# Patient Record
Sex: Female | Born: 1960 | Race: White | Hispanic: No | Marital: Married | Smoking: Never smoker
Health system: Southern US, Community
[De-identification: ages and names within clinical notes are randomized; demographics above are authoritative.]

## PROBLEM LIST (undated history)

## (undated) DIAGNOSIS — R32 Unspecified urinary incontinence: Secondary | ICD-10-CM

## (undated) DIAGNOSIS — J42 Unspecified chronic bronchitis: Secondary | ICD-10-CM

## (undated) DIAGNOSIS — D509 Iron deficiency anemia, unspecified: Secondary | ICD-10-CM

## (undated) DIAGNOSIS — K219 Gastro-esophageal reflux disease without esophagitis: Secondary | ICD-10-CM

## (undated) DIAGNOSIS — K2270 Barrett's esophagus without dysplasia: Secondary | ICD-10-CM

## (undated) HISTORY — DX: Gastro-esophageal reflux disease without esophagitis: K21.9

## (undated) HISTORY — DX: Iron deficiency anemia, unspecified: D50.9

## (undated) HISTORY — DX: Unspecified chronic bronchitis: J42

## (undated) HISTORY — PX: TOTAL KNEE ARTHROPLASTY: SHX125

## (undated) HISTORY — DX: Unspecified urinary incontinence: R32

## (undated) HISTORY — DX: Barrett's esophagus without dysplasia: K22.70

## (undated) HISTORY — PX: ENDOMETRIAL ABLATION: SHX621

## (undated) HISTORY — PX: EXTERNAL EAR SURGERY: SHX627

## (undated) HISTORY — PX: JOINT REPLACEMENT: SHX530

---

## 1999-05-22 ENCOUNTER — Emergency Department (HOSPITAL_COMMUNITY): Admission: EM | Admit: 1999-05-22 | Discharge: 1999-05-22 | Payer: Self-pay | Admitting: Emergency Medicine

## 2002-12-14 ENCOUNTER — Emergency Department (HOSPITAL_COMMUNITY): Admission: EM | Admit: 2002-12-14 | Discharge: 2002-12-14 | Payer: Self-pay | Admitting: Emergency Medicine

## 2003-01-10 ENCOUNTER — Emergency Department (HOSPITAL_COMMUNITY): Admission: EM | Admit: 2003-01-10 | Discharge: 2003-01-10 | Payer: Self-pay

## 2003-05-31 ENCOUNTER — Encounter: Payer: Self-pay | Admitting: Emergency Medicine

## 2003-05-31 ENCOUNTER — Emergency Department (HOSPITAL_COMMUNITY): Admission: EM | Admit: 2003-05-31 | Discharge: 2003-05-31 | Payer: Self-pay | Admitting: Emergency Medicine

## 2009-07-10 ENCOUNTER — Encounter: Payer: Self-pay | Admitting: Family Medicine

## 2009-08-28 ENCOUNTER — Emergency Department (HOSPITAL_COMMUNITY): Admission: EM | Admit: 2009-08-28 | Discharge: 2009-08-28 | Payer: Self-pay | Admitting: Emergency Medicine

## 2009-08-28 ENCOUNTER — Encounter: Payer: Self-pay | Admitting: Family Medicine

## 2009-11-02 ENCOUNTER — Ambulatory Visit: Payer: Self-pay | Admitting: Family Medicine

## 2009-11-02 DIAGNOSIS — D649 Anemia, unspecified: Secondary | ICD-10-CM

## 2009-11-02 DIAGNOSIS — R5383 Other fatigue: Secondary | ICD-10-CM

## 2009-11-02 DIAGNOSIS — R5381 Other malaise: Secondary | ICD-10-CM | POA: Insufficient documentation

## 2009-11-06 LAB — CONVERTED CEMR LAB: Vit D, 25-Hydroxy: 18 ng/mL — ABNORMAL LOW (ref 30–89)

## 2009-11-10 ENCOUNTER — Telehealth (INDEPENDENT_AMBULATORY_CARE_PROVIDER_SITE_OTHER): Payer: Self-pay | Admitting: *Deleted

## 2009-11-10 LAB — CONVERTED CEMR LAB
Albumin: 3.9 g/dL (ref 3.5–5.2)
Alkaline Phosphatase: 68 units/L (ref 39–117)
BUN: 12 mg/dL (ref 6–23)
Bilirubin, Direct: 0.1 mg/dL (ref 0.0–0.3)
Creatinine, Ser: 0.8 mg/dL (ref 0.4–1.2)
Eosinophils Relative: 4.2 % (ref 0.0–5.0)
GFR calc non Af Amer: 82.03 mL/min (ref >60)
Iron: 11 ug/dL — ABNORMAL LOW (ref 42–145)
Lymphocytes Relative: 50.4 % — ABNORMAL HIGH (ref 12.0–46.0)
Monocytes Relative: 27.9 % — ABNORMAL HIGH (ref 3.0–12.0)
Neutrophils Relative %: 15.7 % — ABNORMAL LOW (ref 43.0–77.0)
Platelets: 372 10*3/uL (ref 150.0–400.0)
TSH: 0.83 microintl units/mL (ref 0.35–5.50)
Transferrin: 293.7 mg/dL (ref 212.0–360.0)
Vitamin B-12: 419 pg/mL (ref 211–911)
WBC: 7.2 10*3/uL (ref 4.5–10.5)

## 2009-11-22 ENCOUNTER — Encounter: Payer: Self-pay | Admitting: Family Medicine

## 2010-01-06 ENCOUNTER — Ambulatory Visit: Payer: Self-pay | Admitting: Family Medicine

## 2010-01-06 DIAGNOSIS — H6690 Otitis media, unspecified, unspecified ear: Secondary | ICD-10-CM | POA: Insufficient documentation

## 2010-01-06 LAB — CONVERTED CEMR LAB: Inflenza A Ag: NEGATIVE

## 2010-01-14 ENCOUNTER — Telehealth: Payer: Self-pay | Admitting: Family Medicine

## 2010-01-20 ENCOUNTER — Ambulatory Visit: Payer: Self-pay | Admitting: Family Medicine

## 2010-01-25 LAB — CONVERTED CEMR LAB
Basophils Absolute: 0.1 10*3/uL (ref 0.0–0.1)
Eosinophils Absolute: 0.2 10*3/uL (ref 0.0–0.7)
Hemoglobin: 9.7 g/dL — ABNORMAL LOW (ref 12.0–15.0)
Iron: 15 ug/dL — ABNORMAL LOW (ref 42–145)
Lymphocytes Relative: 38.7 % (ref 12.0–46.0)
Lymphs Abs: 3 10*3/uL (ref 0.7–4.0)
MCHC: 31.1 g/dL (ref 30.0–36.0)
Monocytes Absolute: 2 10*3/uL — ABNORMAL HIGH (ref 0.1–1.0)
Neutro Abs: 2.5 10*3/uL (ref 1.4–7.7)
RDW: 18.4 % — ABNORMAL HIGH (ref 11.5–14.6)
Transferrin: 306.7 mg/dL (ref 212.0–360.0)
Vitamin B-12: 505 pg/mL (ref 211–911)

## 2010-01-28 ENCOUNTER — Telehealth (INDEPENDENT_AMBULATORY_CARE_PROVIDER_SITE_OTHER): Payer: Self-pay | Admitting: *Deleted

## 2010-01-31 ENCOUNTER — Ambulatory Visit: Payer: Self-pay | Admitting: Hematology & Oncology

## 2010-02-10 ENCOUNTER — Encounter: Payer: Self-pay | Admitting: Family Medicine

## 2010-02-10 LAB — CBC WITH DIFFERENTIAL (CANCER CENTER ONLY)
EOS%: 3.2 % (ref 0.0–7.0)
Eosinophils Absolute: 0.3 10*3/uL (ref 0.0–0.5)
LYMPH%: 36.5 % (ref 14.0–48.0)
MCH: 21.4 pg — ABNORMAL LOW (ref 26.0–34.0)
MCHC: 30.7 g/dL — ABNORMAL LOW (ref 32.0–36.0)
MCV: 70 fL — ABNORMAL LOW (ref 81–101)
MONO%: 7.6 % (ref 0.0–13.0)
Platelets: 376 10*3/uL (ref 145–400)
RBC: 4.87 10*6/uL (ref 3.70–5.32)

## 2010-02-10 LAB — CHCC SATELLITE - SMEAR

## 2010-02-11 LAB — TRANSFERRIN RECEPTOR, SOLUABLE: Transferrin Receptor, Soluble: 32.1 nmol/L

## 2010-02-12 LAB — COMPREHENSIVE METABOLIC PANEL
Alkaline Phosphatase: 84 U/L (ref 39–117)
CO2: 17 mEq/L — ABNORMAL LOW (ref 19–32)
Creatinine, Ser: 0.81 mg/dL (ref 0.40–1.20)
Glucose, Bld: 92 mg/dL (ref 70–99)
Total Bilirubin: 0.2 mg/dL — ABNORMAL LOW (ref 0.3–1.2)

## 2010-02-18 LAB — FECAL OCCULT BLOOD, GUIAC - CHCC SATELLITE: Occult Blood: NEGATIVE

## 2010-03-29 ENCOUNTER — Ambulatory Visit: Payer: Self-pay | Admitting: Hematology & Oncology

## 2010-04-01 ENCOUNTER — Encounter: Payer: Self-pay | Admitting: Family Medicine

## 2010-04-01 LAB — CBC WITH DIFFERENTIAL (CANCER CENTER ONLY)
BASO%: 0.9 % (ref 0.0–2.0)
Eosinophils Absolute: 0.3 10*3/uL (ref 0.0–0.5)
LYMPH#: 2.5 10*3/uL (ref 0.9–3.3)
LYMPH%: 34.6 % (ref 14.0–48.0)
MONO#: 0.5 10*3/uL (ref 0.1–0.9)
NEUT#: 3.8 10*3/uL (ref 1.5–6.5)
Platelets: 328 10*3/uL (ref 145–400)
RBC: 4.71 10*6/uL (ref 3.70–5.32)
RDW: 21.1 % — ABNORMAL HIGH (ref 10.5–14.6)
WBC: 7.1 10*3/uL (ref 3.9–10.0)

## 2010-04-01 LAB — CHCC SATELLITE - SMEAR

## 2010-04-03 LAB — RETICULOCYTES (CHCC)
ABS Retic: 28.1 10*3/uL (ref 19.0–186.0)
RBC.: 4.68 MIL/uL (ref 3.87–5.11)
Retic Ct Pct: 0.6 % (ref 0.4–3.1)

## 2010-04-03 LAB — FERRITIN: Ferritin: 72 ng/mL (ref 10–291)

## 2010-04-20 ENCOUNTER — Emergency Department (HOSPITAL_BASED_OUTPATIENT_CLINIC_OR_DEPARTMENT_OTHER): Admission: EM | Admit: 2010-04-20 | Discharge: 2010-04-20 | Payer: Self-pay | Admitting: Emergency Medicine

## 2010-04-20 ENCOUNTER — Ambulatory Visit: Payer: Self-pay | Admitting: Family

## 2010-04-20 ENCOUNTER — Telehealth: Payer: Self-pay | Admitting: Family Medicine

## 2010-04-20 ENCOUNTER — Telehealth (INDEPENDENT_AMBULATORY_CARE_PROVIDER_SITE_OTHER): Payer: Self-pay | Admitting: *Deleted

## 2010-04-20 ENCOUNTER — Ambulatory Visit: Payer: Self-pay | Admitting: Diagnostic Radiology

## 2010-04-20 DIAGNOSIS — R4182 Altered mental status, unspecified: Secondary | ICD-10-CM | POA: Insufficient documentation

## 2010-04-20 DIAGNOSIS — R3129 Other microscopic hematuria: Secondary | ICD-10-CM

## 2010-04-20 LAB — CONVERTED CEMR LAB
Bilirubin Urine: NEGATIVE
Nitrite: NEGATIVE
Urobilinogen, UA: 0.2
WBC Urine, dipstick: NEGATIVE

## 2010-04-21 ENCOUNTER — Encounter: Payer: Self-pay | Admitting: Family

## 2010-04-22 ENCOUNTER — Telehealth (INDEPENDENT_AMBULATORY_CARE_PROVIDER_SITE_OTHER): Payer: Self-pay | Admitting: *Deleted

## 2010-04-26 ENCOUNTER — Ambulatory Visit: Payer: Self-pay | Admitting: Family Medicine

## 2010-04-27 ENCOUNTER — Encounter (INDEPENDENT_AMBULATORY_CARE_PROVIDER_SITE_OTHER): Payer: Self-pay | Admitting: *Deleted

## 2010-04-27 ENCOUNTER — Telehealth: Payer: Self-pay | Admitting: Family Medicine

## 2010-05-13 ENCOUNTER — Ambulatory Visit (HOSPITAL_COMMUNITY): Admission: RE | Admit: 2010-05-13 | Discharge: 2010-05-13 | Payer: Self-pay | Admitting: Family Medicine

## 2010-06-21 ENCOUNTER — Ambulatory Visit: Payer: Self-pay | Admitting: Hematology & Oncology

## 2010-07-13 ENCOUNTER — Encounter: Payer: Self-pay | Admitting: Family Medicine

## 2010-07-13 LAB — CBC WITH DIFFERENTIAL (CANCER CENTER ONLY)
BASO#: 0.1 10*3/uL (ref 0.0–0.2)
EOS%: 3.7 % (ref 0.0–7.0)
Eosinophils Absolute: 0.3 10*3/uL (ref 0.0–0.5)
HGB: 13.5 g/dL (ref 11.6–15.9)
LYMPH#: 2.2 10*3/uL (ref 0.9–3.3)
MCHC: 33.7 g/dL (ref 32.0–36.0)
MONO%: 6.1 % (ref 0.0–13.0)
NEUT#: 5.1 10*3/uL (ref 1.5–6.5)
Platelets: 334 10*3/uL (ref 145–400)
RBC: 4.38 10*6/uL (ref 3.70–5.32)

## 2010-07-13 LAB — RETICULOCYTES (CHCC)
ABS Retic: 79.6 10*3/uL (ref 19.0–186.0)
RBC.: 4.42 MIL/uL (ref 3.87–5.11)
Retic Ct Pct: 1.8 % (ref 0.4–3.1)

## 2010-07-13 LAB — FERRITIN: Ferritin: 46 ng/mL (ref 10–291)

## 2010-08-23 ENCOUNTER — Ambulatory Visit: Payer: Self-pay | Admitting: Family Medicine

## 2010-08-23 DIAGNOSIS — M25569 Pain in unspecified knee: Secondary | ICD-10-CM

## 2010-08-24 ENCOUNTER — Encounter: Payer: Self-pay | Admitting: Family Medicine

## 2010-10-05 ENCOUNTER — Ambulatory Visit: Payer: Self-pay | Admitting: Internal Medicine

## 2010-10-05 DIAGNOSIS — E559 Vitamin D deficiency, unspecified: Secondary | ICD-10-CM | POA: Insufficient documentation

## 2010-10-05 DIAGNOSIS — R51 Headache: Secondary | ICD-10-CM

## 2010-10-05 DIAGNOSIS — IMO0001 Reserved for inherently not codable concepts without codable children: Secondary | ICD-10-CM | POA: Insufficient documentation

## 2010-10-05 DIAGNOSIS — D509 Iron deficiency anemia, unspecified: Secondary | ICD-10-CM

## 2010-10-05 DIAGNOSIS — R519 Headache, unspecified: Secondary | ICD-10-CM | POA: Insufficient documentation

## 2010-10-07 LAB — CONVERTED CEMR LAB
Basophils Absolute: 0 10*3/uL (ref 0.0–0.1)
Basophils Relative: 0.3 % (ref 0.0–3.0)
Eosinophils Absolute: 0.1 10*3/uL (ref 0.0–0.7)
HCT: 41.1 % (ref 36.0–46.0)
Hemoglobin: 14.1 g/dL (ref 12.0–15.0)
Lymphs Abs: 2.5 10*3/uL (ref 0.7–4.0)
MCHC: 34.4 g/dL (ref 30.0–36.0)
MCV: 93 fL (ref 78.0–100.0)
Magnesium: 2.1 mg/dL (ref 1.5–2.5)
Neutro Abs: 3.7 10*3/uL (ref 1.4–7.7)
Potassium: 4.2 meq/L (ref 3.5–5.1)
RDW: 13.5 % (ref 11.5–14.6)
Total CK: 60 units/L (ref 7–177)

## 2010-11-14 ENCOUNTER — Ambulatory Visit: Payer: Self-pay | Admitting: Hematology & Oncology

## 2010-11-16 ENCOUNTER — Encounter: Payer: Self-pay | Admitting: Family Medicine

## 2010-11-16 LAB — CBC WITH DIFFERENTIAL (CANCER CENTER ONLY)
BASO#: 0.1 10*3/uL (ref 0.0–0.2)
BASO%: 0.6 % (ref 0.0–2.0)
Eosinophils Absolute: 0.2 10*3/uL (ref 0.0–0.5)
HCT: 39.8 % (ref 34.8–46.6)
HGB: 13.6 g/dL (ref 11.6–15.9)
LYMPH#: 2.4 10*3/uL (ref 0.9–3.3)
MCV: 93 fL (ref 81–101)
MONO#: 0.6 10*3/uL (ref 0.1–0.9)
NEUT%: 61.6 % (ref 39.6–80.0)
RBC: 4.3 10*6/uL (ref 3.70–5.32)
WBC: 8.5 10*3/uL (ref 3.9–10.0)

## 2010-11-16 LAB — RETICULOCYTES (CHCC)
ABS Retic: 64.2 10*3/uL (ref 19.0–186.0)
RBC.: 4.28 MIL/uL (ref 3.87–5.11)

## 2010-11-16 LAB — FERRITIN: Ferritin: 37 ng/mL (ref 10–291)

## 2010-12-11 HISTORY — PX: COLONOSCOPY: SHX174

## 2010-12-15 IMAGING — CT CT HEAD W/O CM
1 series · 16 of 30 positions shown, 20 images · non-contrast
Comparison: None.

CLINICAL DATA: Headache and confusion.  Dizziness.

CT HEAD WITHOUT CONTRAST
TECHNIQUE: Contiguous axial images were obtained from the base of
the skull through the vertex without contrast.

[Series 2: head 4.8 h37s · axial · 0.43mm/px · z∈[+1164,+1301]mm · 16 of 32 slices shown, 20 images]
[im 2/32  brain]
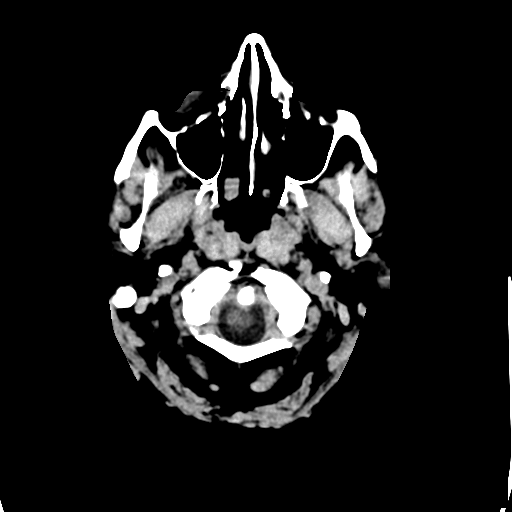
[im 2/32  bone]
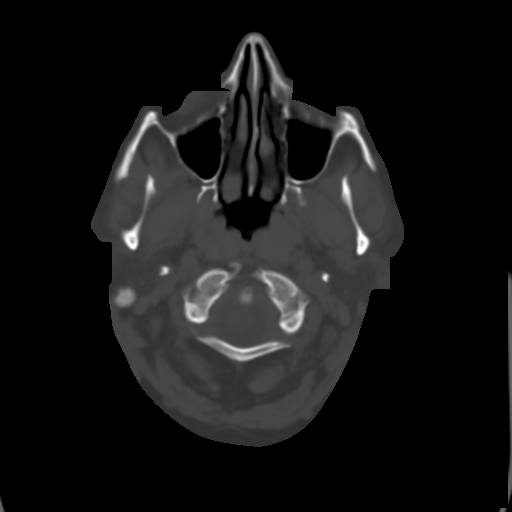
[im 4/32  brain]
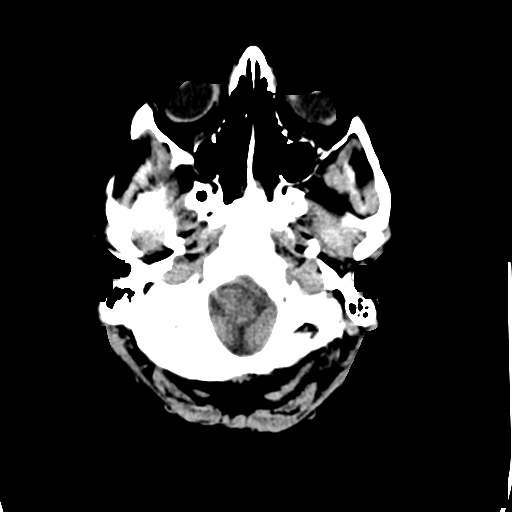
[im 6/32  brain]
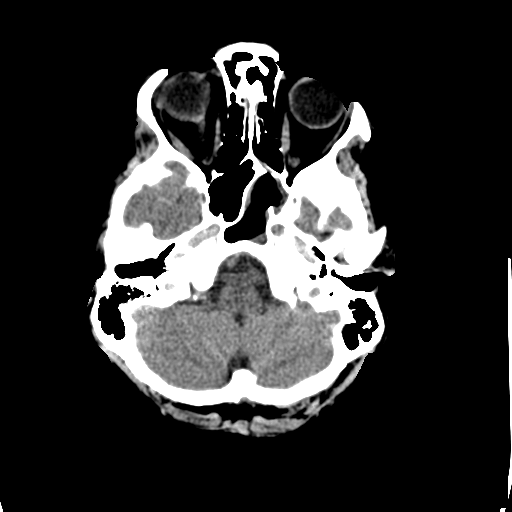
[im 8/32  brain]
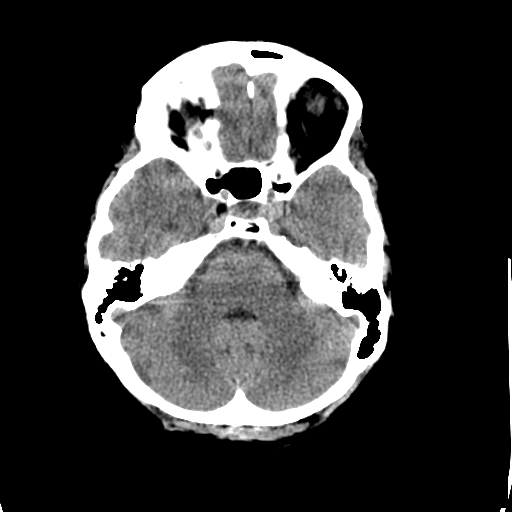
[im 9/32  brain]
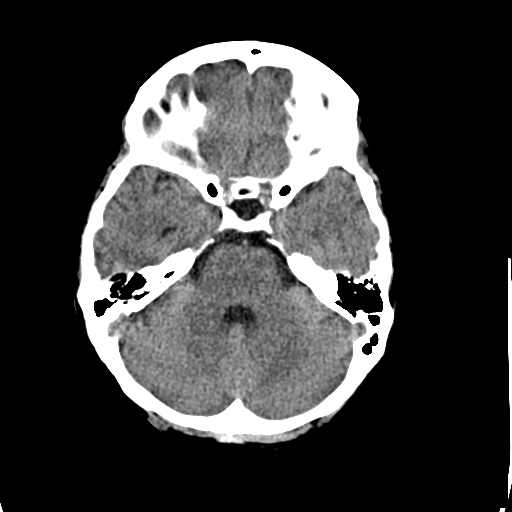
[im 9/32  bone]
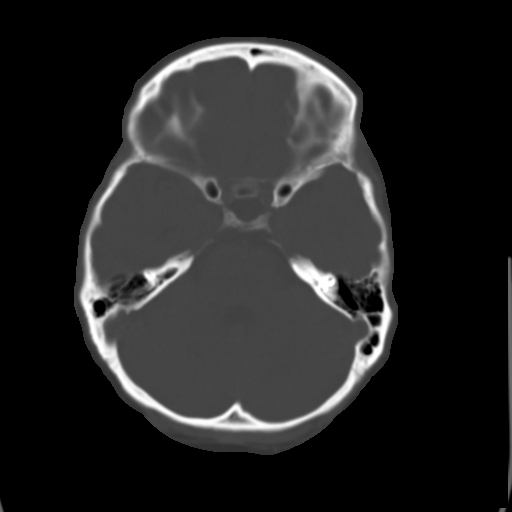
[im 11/32  brain]
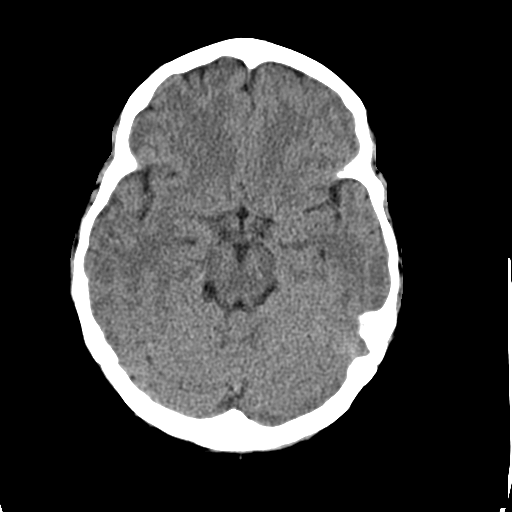
[im 13/32  brain]
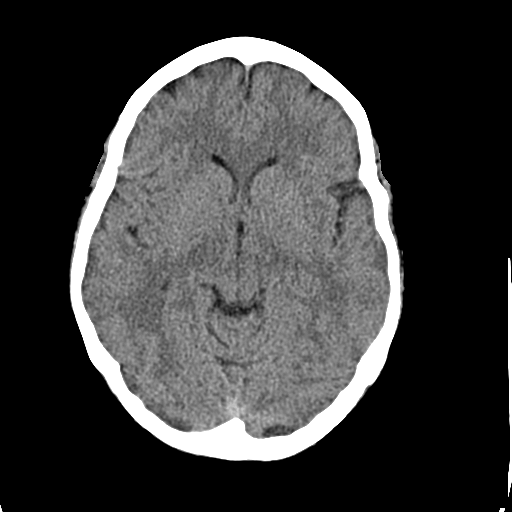
[im 15/32  brain]
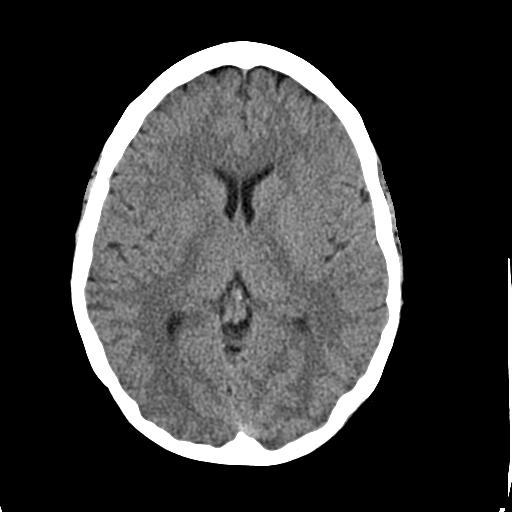
[im 17/32  brain]
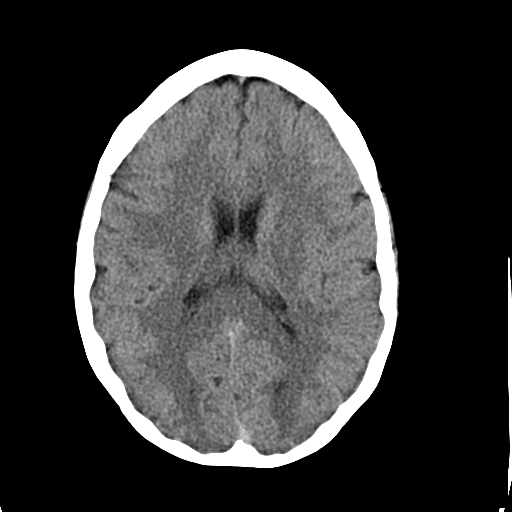
[im 17/32  bone]
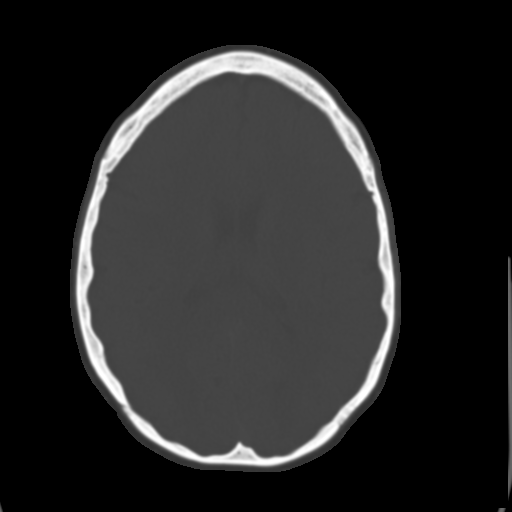
[im 19/32  brain]
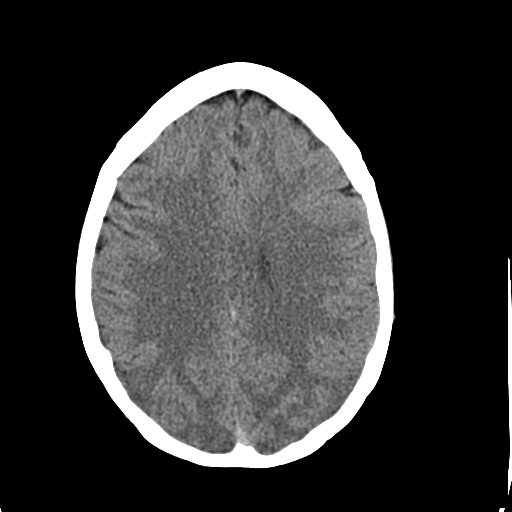
[im 21/32  brain]
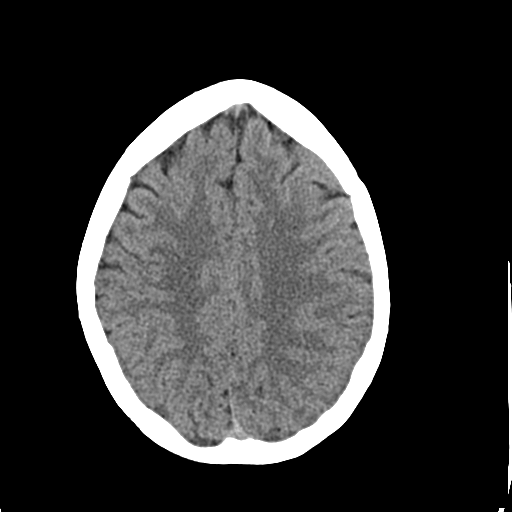
[im 23/32  brain]
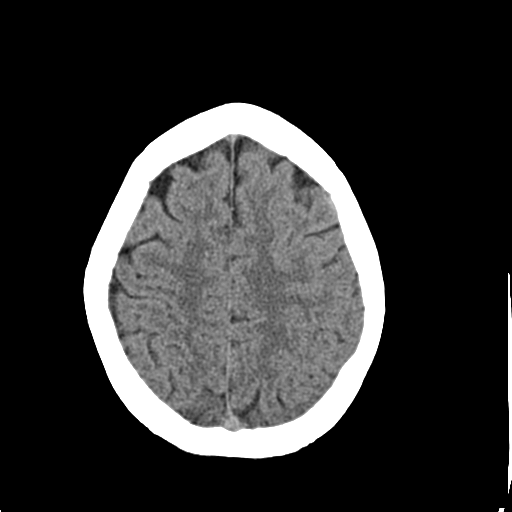
[im 24/32  brain]
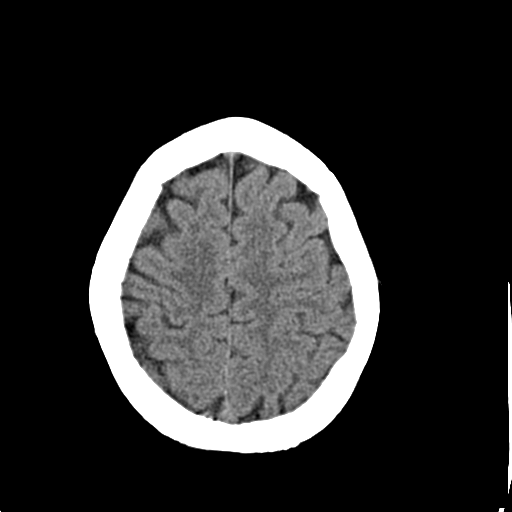
[im 24/32  bone]
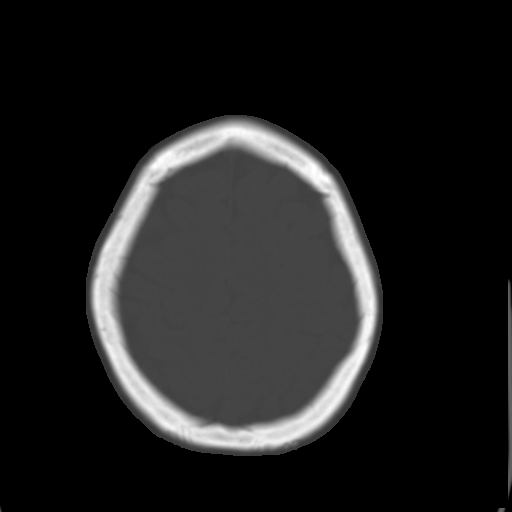
[im 26/32  brain]
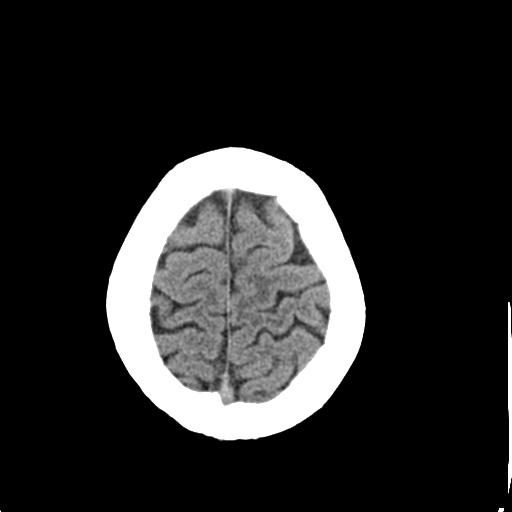
[im 28/32  brain]
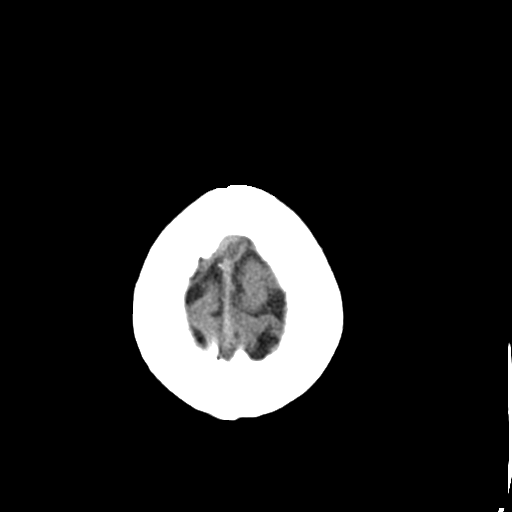
[im 30/32  brain]
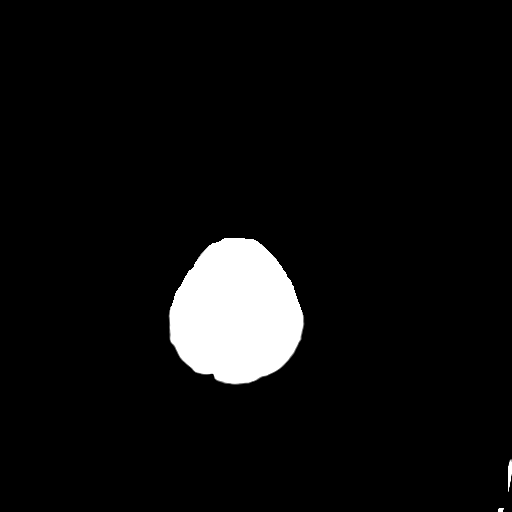

[16 of 30 positions shown; findings below may reference images not displayed]

FINDINGS: There is no acute intracranial hemorrhage, acute
infarction, or intracranial mass lesion.  There is a prominent
sulcus in the right occipital lobe on image number 19 and 20 but
this is not felt to represent an infarct.  No bony abnormalities of
significance.  There is mild mucosal thickening in the left side of
the sphenoid sinus.
IMPRESSION: No acute intracranial abnormalities.

## 2010-12-28 ENCOUNTER — Ambulatory Visit
Admission: RE | Admit: 2010-12-28 | Discharge: 2010-12-28 | Payer: Self-pay | Source: Home / Self Care | Attending: Internal Medicine | Admitting: Internal Medicine

## 2010-12-28 DIAGNOSIS — J069 Acute upper respiratory infection, unspecified: Secondary | ICD-10-CM | POA: Insufficient documentation

## 2011-01-07 IMAGING — CR DG CHEST 2V
2 series · 2 of 2 positions shown · non-contrast
Comparison: 04/20/2010

CLINICAL DATA: Acute bronchitis.  Cough.

CHEST - 2 VIEW

[view not recorded (1 of 2)]
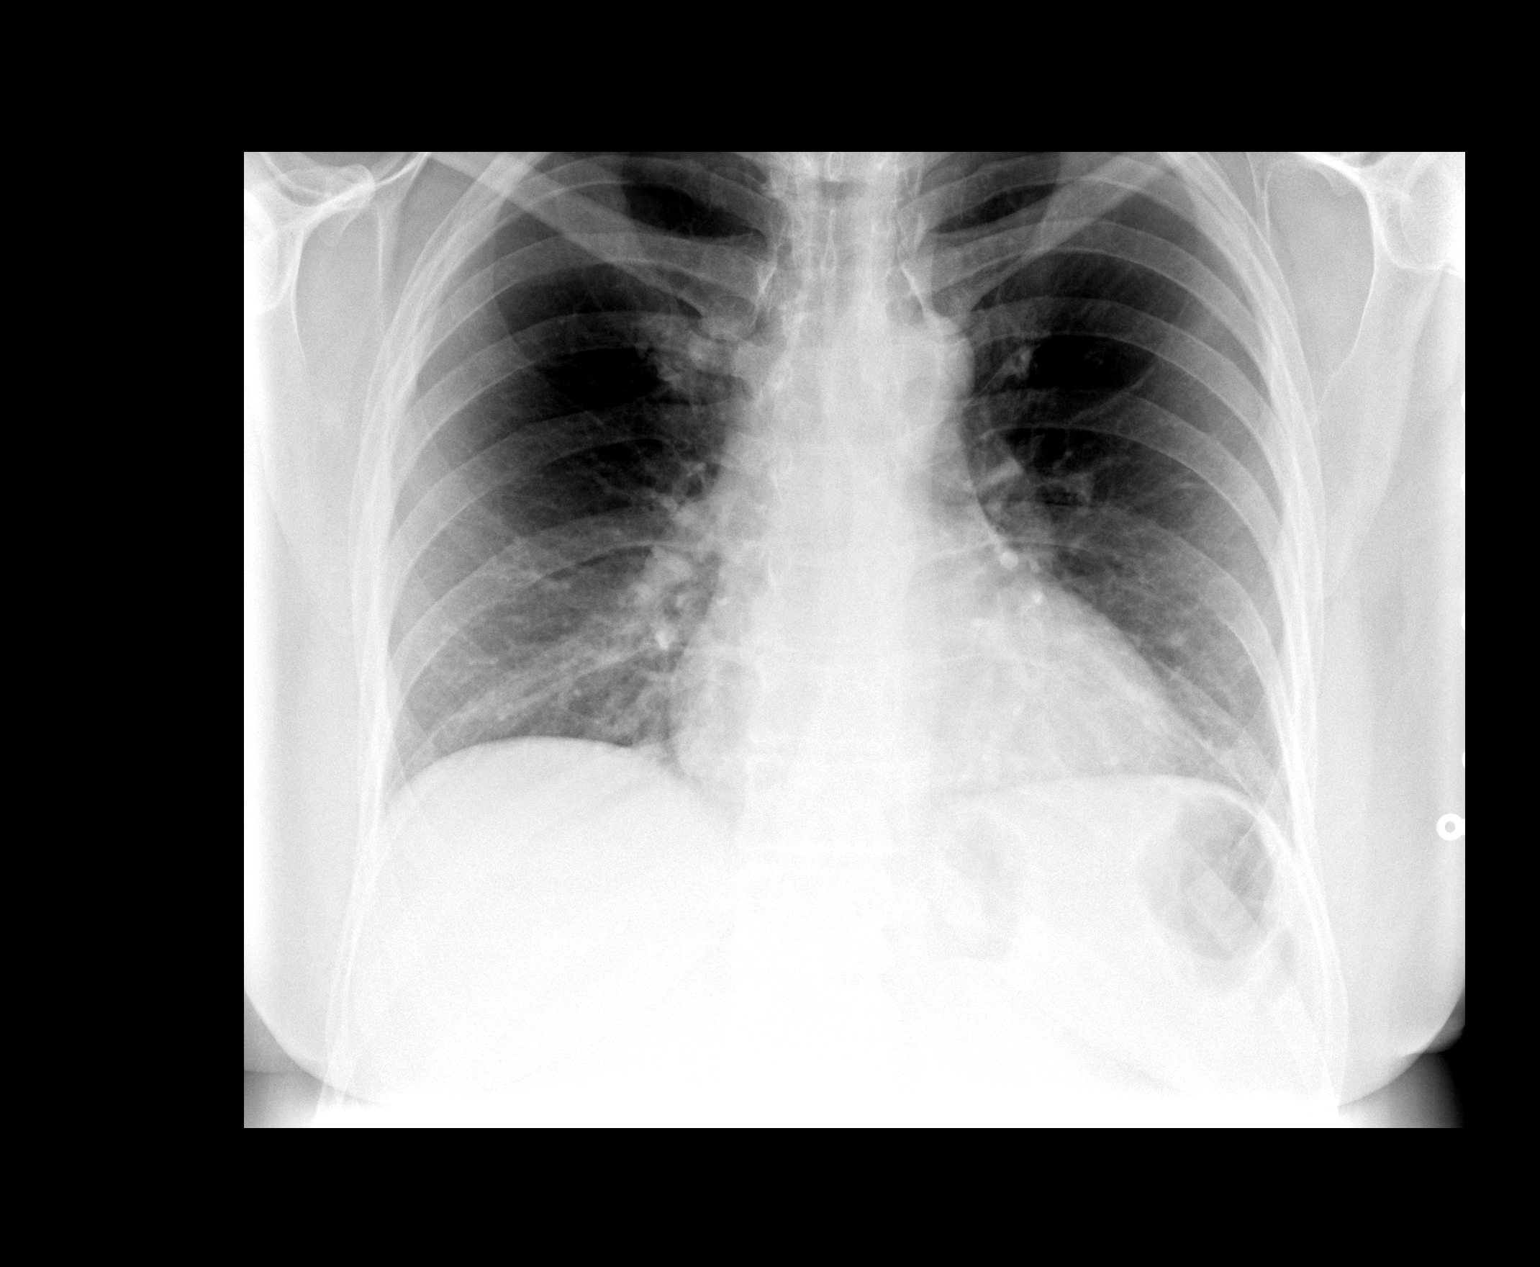

[view not recorded (2 of 2)]
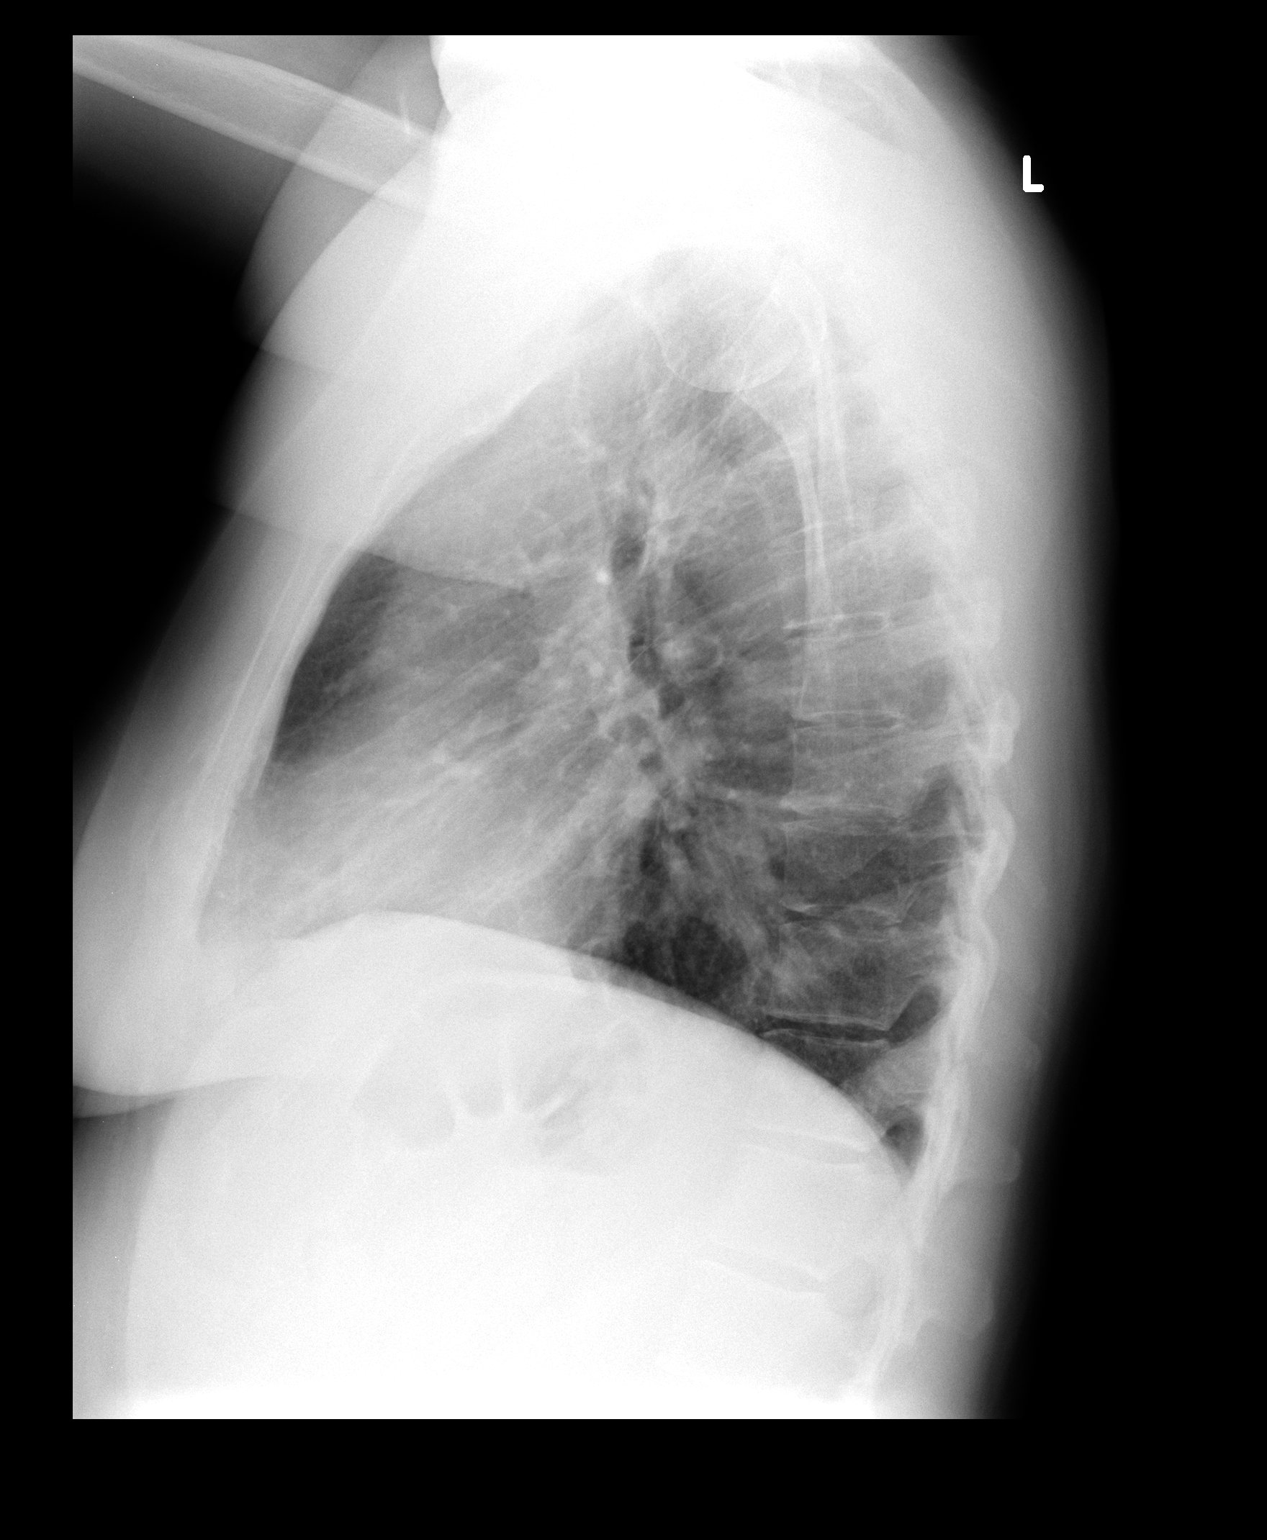

[2 of 2 positions shown; findings below may reference images not displayed]

FINDINGS: The cardiac silhouette, mediastinal and hilar contours
are within normal limits and stable.  Low lung volumes with
vascular crowding and bibasilar atelectasis.  No infiltrates, edema
or effusions.  The bony thorax is intact
IMPRESSION: Low lung volumes with vascular crowding and bibasilar atelectasis.
No infiltrates, edema or effusions.

## 2011-01-10 NOTE — Progress Notes (Signed)
Summary: still no better  Phone Note Call from Patient Call back at Home Phone [phone]   Caller: Patient Summary of Call: PT left VM that she was seen on 01-06-10 for sinus infection and was rx CHERATUSSIN and Biaxin. pt still c/o sore throat, ear pain and head congestion. pt would like to know if you can rx another med. pls advise...............Marland KitchenFelecia Deloach CMA  January 14, 2010 2:05 PM   Follow-up for Phone Call        avelox 400 1 by mouth once daily #10  ----  ov if no better  Follow-up by: Loreen Freud DO,  January 14, 2010 2:59 PM  Additional Follow-up for Phone Call Additional follow up Details #1::        pt aware rx sent to pharmacy....................Marland KitchenFelecia Deloach CMA  January 14, 2010 3:57 PM     New/Updated Medications: AVELOX 400 MG TABS (MOXIFLOXACIN HCL) 1 by mouth once daily Prescriptions: AVELOX 400 MG TABS (MOXIFLOXACIN HCL) 1 by mouth once daily  #10 x 0   Entered by:   Jeremy Johann CMA   Authorized by:   Loreen Freud DO   Signed by:   Jeremy Johann CMA on 01/14/2010   Method used:   Faxed to ...       [facility]       [address]       [place], [place]  [address]       Ph: [phone]       Fax: [phone]   RxID:   [id number]

## 2011-01-10 NOTE — Progress Notes (Signed)
Summary: ed f/u scheduled [phone]  Phone Note Outgoing Call   Summary of Call: Please contact patient and arrange a follow up visit with Dr. Laury Axon early next week.  Thanks Initial call taken by: Lemont Fillers FNP,  Apr 22, 2010 9:12 AM  Follow-up for Phone Call        patient has appt scheduled [id number]  Follow-up by: Okey Regal Spring,  Apr 22, 2010 1:07 PM

## 2011-01-10 NOTE — Assessment & Plan Note (Signed)
Summary: Still feels bad& feels like its moved to her chest- jr   Vital Signs:  Patient profile:   50 year old female Weight:      231 pounds Temp:     98.2 degrees F oral Pulse rate:   86 / minute Pulse rhythm:   regular BP sitting:   124 / 82  (left arm) Cuff size:   regular  Vitals Entered By: Army Fossa CMA (January 20, 2010 3:22 PM) CC: Pt states she no longer has the HA, she has a lot of chest congestion.   History of Present Illness: Pt states the symptoms are no longer in her head.  She feels like they have moved into her chest.  See last ov.  Current Medications (verified): 1)  Nexium 20 Mg Cpdr (Esomeprazole Magnesium) .Marland Kitchen.. 1 By Mouth Daily. 2)  Iron 25 Mg Tabs (Iron) .Marland Kitchen.. 1 By Mouth Three Times A Day 3)  Avelox 400 Mg Tabs (Moxifloxacin Hcl) .Marland Kitchen.. 1 By Mouth Once Daily 4)  Cheratussin Ac 100-10 Mg/1ml Syrp (Guaifenesin-Codeine) .Marland Kitchen.. 1-2 Tsp By Mouth At Bedtime 5)  Nasonex 50 Mcg/act Susp (Mometasone Furoate) 6)  Prednisone 10 Mg Tabs (Prednisone) .... 3 By Mouth Once Daily For 3 Days Then2 By Mouth Once Daily For 3 Days Then 1 By Mouth Once Daily For 3 Days 7)  Symbicort 160-4.5 Mcg/act Aero (Budesonide-Formoterol Fumarate) .... 2 Puffs Two Times A Day 8)  Proair Hfa 108 (90 Base) Mcg/act Aers (Albuterol Sulfate) .... 2 Puffs Qid As Needed  Allergies: 1)  ! Penicillin 2)  ! * Maxiquin 3)  ! * Tamiflu 4)  ! Sulfa 5)  ! Fluzone (Influenza Virus Vaccine Split)  Past History:  Past medical, surgical, family and social histories (including risk factors) reviewed for relevance to current acute and chronic problems.  Past Medical History: Reviewed history from 11/02/2009 and no changes required. Anemia GERD Chronic bronchitis Urine incontinence UTI   Past Surgical History: Reviewed history from 11/02/2009 and no changes required. Andoscopy- 2005 Ear bone replacement- 2004  Family History: Reviewed history from 11/02/2009 and no changes required. Family  History of Alcoholism/Addiction Family History Breast cancer 1st degree relative <50 Family History of Stroke F 1st degree relative <60 Family History of Sudden Death Heart disease   Social History: Reviewed history from 11/02/2009 and no changes required. Married Never Smoked Alcohol use-yes Drug use-no Regular exercise-no  Review of Systems      See HPI  Physical Exam  General:  Well-developed,well-nourished,in no acute distress; alert,appropriate and cooperative throughout examination Ears:  External ear exam shows no significant lesions or deformities.  Otoscopic examination reveals clear canals, tympanic membranes are intact bilaterally without bulging, retraction, inflammation or discharge. Hearing is grossly normal bilaterally. Nose:  External nasal examination shows no deformity or inflammation. Nasal mucosa are pink and moist without lesions or exudates. Mouth:  Oral mucosa and oropharynx without lesions or exudates.  Teeth in good repair. Neck:  No deformities, masses, or tenderness noted. Lungs:  R decreased breath sounds and L decreased breath sounds.   Heart:  normal rate and no murmur.   Extremities:  No clubbing, cyanosis, edema, or deformity noted with normal full range of motion of all joints.   Neurologic:  No cranial nerve deficits noted. Station and gait are normal. Plantar reflexes are down-going bilaterally. DTRs are symmetrical throughout. Sensory, motor and coordinative functions appear intact. Skin:  Intact without suspicious lesions or rashes Cervical Nodes:  No lymphadenopathy noted Psych:  Oriented  X3 and normally interactive.     Impression & Recommendations:  Problem # 1:  BRONCHITIS- ACUTE (ICD-466.0)  Her updated medication list for this problem includes:    Avelox 400 Mg Tabs (Moxifloxacin hcl) .Marland Kitchen... 1 by mouth once daily    Cheratussin Ac 100-10 Mg/67ml Syrp (Guaifenesin-codeine) .Marland Kitchen... 1-2 tsp by mouth at bedtime    Symbicort 160-4.5 Mcg/act  Aero (Budesonide-formoterol fumarate) .Marland Kitchen... 2 puffs two times a day    Proair Hfa 108 (90 Base) Mcg/act Aers (Albuterol sulfate) .Marland Kitchen... 2 puffs qid as needed  Take antibiotics and other medications as directed. Encouraged to push clear liquids, get enough rest, and take acetaminophen as needed. To be seen in 5-7 days if no improvement, sooner if worse.  Orders: Admin of Therapeutic Inj  intramuscular or subcutaneous (16109) Depo- Medrol 80mg  (J1040)  Complete Medication List: 1)  Nexium 20 Mg Cpdr (Esomeprazole magnesium) .Marland Kitchen.. 1 by mouth daily. 2)  Iron 25 Mg Tabs (Iron) .Marland Kitchen.. 1 by mouth three times a day 3)  Avelox 400 Mg Tabs (Moxifloxacin hcl) .Marland Kitchen.. 1 by mouth once daily 4)  Cheratussin Ac 100-10 Mg/5ml Syrp (Guaifenesin-codeine) .Marland Kitchen.. 1-2 tsp by mouth at bedtime 5)  Nasonex 50 Mcg/act Susp (Mometasone furoate) 6)  Prednisone 10 Mg Tabs (Prednisone) .... 3 by mouth once daily for 3 days then2 by mouth once daily for 3 days then 1 by mouth once daily for 3 days 7)  Symbicort 160-4.5 Mcg/act Aero (Budesonide-formoterol fumarate) .... 2 puffs two times a day 8)  Proair Hfa 108 (90 Base) Mcg/act Aers (Albuterol sulfate) .... 2 puffs qid as needed  Other Orders: Venipuncture (60454) TLB-B12 + Folate Pnl (09811_91478-G95/AOZ) TLB-IBC Pnl (Iron/FE;Transferrin) (83550-IBC) TLB-CBC Platelet - w/Differential (85025-CBCD) TLB-Ferritin (82728-FER) Prescriptions: PROAIR HFA 108 (90 BASE) MCG/ACT AERS (ALBUTEROL SULFATE) 2 puffs qid as needed  #1 x 0   Entered and Authorized by:   Loreen Freud DO   Signed by:   Loreen Freud DO on 01/20/2010   Method used:   Historical   RxID:   [id number] SYMBICORT 160-4.5 MCG/ACT AERO (BUDESONIDE-FORMOTEROL FUMARATE) 2 puffs two times a day  #1 x 0   Entered and Authorized by:   Loreen Freud DO   Signed by:   Loreen Freud DO on 01/20/2010   Method used:   Historical   RxID:   [id number] PREDNISONE 10 MG TABS (PREDNISONE) 3 by mouth once daily for 3  days then2 by mouth once daily for 3 days then 1 by mouth once daily for 3 days  #18 x 0   Entered and Authorized by:   Loreen Freud DO   Signed by:   Loreen Freud DO on 01/20/2010   Method used:   Print then Give to Patient   RxID:   [id number]      Medication Administration  Injection # 1:    Medication: Depo- Medrol 80mg     Diagnosis: BRONCHITIS- ACUTE (ICD-466.0)    Route: IM    Site: RUOQ gluteus    Exp Date: 09/2010    Lot #: [id number]    Mfr: [email or web address]    Patient tolerated injection without complications    Given by: Army Fossa CMA (January 20, 2010 4:26 PM)  Orders Added: 1)  Venipuncture [64332] 2)  TLB-B12 + Folate Pnl [82746_82607-B12/FOL] 3)  TLB-IBC Pnl (Iron/FE;Transferrin) [83550-IBC] 4)  TLB-CBC Platelet - w/Differential [85025-CBCD] 5)  TLB-Ferritin [82728-FER] 6)  Est. Patient Level IV [95188] 7)  Admin of Therapeutic Inj  intramuscular or subcutaneous [96372] 8)  Depo- Medrol 80mg  [J1040]

## 2011-01-10 NOTE — Progress Notes (Signed)
Summary: BLOOD TRANFUSION  Phone Note Call from Patient Call back at Home Phone [phone]   Caller: Patient Call For: Amber Freud DO Reason for Call: Talk to Nurse Summary of Call: PATIENT CALLING, WANTS TO KNOW IF SHE IS GOING TO NEED A BLOOD TRANSFUSION DUE TO HER LOW BLOOD COUNT.  WOULD LIKE SOMEONE TO CALL HER BACK TODAY. Initial call taken by: Magdalen Spatz [facility],  January 28, 2010 1:31 PM  Follow-up for Phone Call        no---its not even close to that ----we normally don't transfuse until < 8.0----  recheck cbc 1 month from last lab Follow-up by: Amber Freud DO,  January 28, 2010 1:41 PM  Additional Follow-up for Phone Call Additional follow up Details #1::        spoke w/ patient says she is getting a little frustrated would like to know why she isn't getting any better has been going through this for about a year says she has been taking b12 supplements and taking iron and it doesn't seem to be resolving her issue b/c she is still very fatigue and just not feeling well any other suggestions other than to keep having labs drawn advised Dr. Laury Axon out of office but patient is ok w/ waiting until mon.....Marland KitchenMarland KitchenDoristine Devoid  January 28, 2010 4:49 PM     Additional Follow-up for Phone Call Additional follow up Details #2::    if pt is takin iron and is not having heavy periods-----refer to heme Follow-up by: Amber Freud DO,  January 30, 2010 5:30 PM  Additional Follow-up for Phone Call Additional follow up Details #3:: Details for Additional Follow-up Action Taken: left message to call office..............Marland KitchenFelecia Deloach CMA  January 31, 2010 8:58 AM  left message to call office............Marland KitchenFelecia Deloach CMA  January 31, 2010 1:10 PM   pt aware..............Marland KitchenFelecia Deloach CMA  January 31, 2010 2:35 PM

## 2011-01-10 NOTE — Progress Notes (Signed)
Summary: work note  Phone Note Call from Patient Call back at [employer] [phone]   Caller: Patient Summary of Call: Pt is requesting a doctors note for work, she needs is for today and tomorrow. Is this okay to do. Army Fossa CMA  Apr 27, 2010 11:27 AM   Follow-up for Phone Call        thats is ok Follow-up by: Loreen Freud DO,  Apr 27, 2010 11:48 AM  Additional Follow-up for Phone Call Additional follow up Details #1::        Pt aware note is ready for pick up. Army Fossa CMA  Apr 27, 2010 11:53 AM

## 2011-01-10 NOTE — Progress Notes (Signed)
Summary: patient doesnt sound right   Phone Note Call from Patient Call back at Home Phone [phone]   Caller: Patient Summary of Call: patient has headache - not feeling well - cant remember talking to her mom earlier --- scheduled her [phone] with dr Laury Axon - but patient doesnt sound right - Initial call taken by: Okey Regal Spring,  Apr 20, 2010 2:43 PM  Follow-up for Phone Call        pt c/o  headache, fatigue and just not feeling well x1day. pt denies any chest pain, numbness, and tingling. pt has pending OV this afternoon..........Marland KitchenFelecia Deloach CMA  Apr 20, 2010 2:54 PM

## 2011-01-10 NOTE — Letter (Signed)
Summary: Out of Work  [facility] [employer]  [address] [place], [place] [address]   Phone: [phone]  Fax: [phone]    January 20, 2010   Employee:  Amber Hebert    To Whom It May Concern:   For Medical reasons, please excuse the above named employee from work for the following dates:  Start:   01/15/2010  End:   01/20/2010  If you need additional information, please feel free to contact our office.         Sincerely,    Loreen Freud DO

## 2011-01-10 NOTE — Consult Note (Signed)
[facility]   Imported By: Lanelle Bal 03/03/2010 12:35:25  _____________________________________________________________________  External Attachment:    Type:   Image     Comment:   External Document

## 2011-01-10 NOTE — Letter (Signed)
[facility]   Imported By: Lanelle Bal 07/29/2010 11:13:38  _____________________________________________________________________  External Attachment:    Type:   Image     Comment:   External Document

## 2011-01-10 NOTE — Progress Notes (Signed)
  Phone Note Other Incoming   Caller: [facility] ED Summary of Call: Pt evaluated there today with headache and cognitive impairment.  Nonfocal neuro exam. Some amnesia symptoms.  Multiple labs unremarkable according to ED attending.  CT head and MRI head unremarkable.  Etiology unclear.  ?atypical migraine.  ?transient global amnesia but her amnesia was not really global.  ED attending recommending close office follow up to reassess. Initial call taken by: Evelena Peat MD,  Apr 20, 2010 10:50 PM     Appended Document:  pt needs ov for er f/u

## 2011-01-10 NOTE — Assessment & Plan Note (Signed)
Summary: headache, fatigue//fd   Vital Signs:  Patient profile:   50 year old female Height:      70 inches Weight:      227.25 pounds BMI:     32.72 O2 Sat:      99 % on Room air Temp:     97.3 degrees F oral Pulse rate:   74 / minute Pulse rhythm:   regular Resp:     18 per minute BP sitting:   110 / 78  (right arm) Cuff size:   large  Vitals Entered By: Mervin Kung CMA (Apr 20, 2010 3:32 PM)  O2 Flow:  Room air CC: room 3  Pt states she has had a headache, fatigue and confusion x 1 day. Had conversation with her mother this morning that she doesn't remember. Is Patient Diabetic? No   Primary Care Provider:  Laury Axon  CC:  room 3  Pt states she has had a headache and fatigue and confusion x 1 day. Had conversation with her mother this morning that she doesn't remember..  History of Present Illness: Ms Amber Hebert is a 50 year old female who presents today with complain of headache since this morning and "not feeling right."  Called her mother this AM- and tells me that she does not remember her conversation with her mother.  Notes extreme fatigue- feels like she did when her hemoglobin was down around 7-8.  [facility] Dr Myna Hidalgo and has had 2 doses of IV Iron.  Pain on the top of her head.  Had mild sore throat at work yesterday.  Works as a Science writer for [employer]. Denies nausea, vomitting or diarrhea.  Has been caring for her sick brother with cancer.  Denies dysuria or frequency.    Allergies: 1)  ! Penicillin 2)  ! * Maxiquin 3)  ! * Tamiflu 4)  ! Sulfa 5)  ! Fluzone (Influenza Virus Vaccine Split)  Past History:  Past Medical History: Last updated: 11/02/2009 Anemia GERD Chronic bronchitis Urine incontinence UTI   Past Surgical History: Last updated: 11/02/2009 Andoscopy- 2005 Ear bone replacement- 2004  Family History: Last updated: 11/02/2009 Family History of Alcoholism/Addiction Family History Breast cancer 1st degree relative  <50 Family History of Stroke F 1st degree relative <60 Family History of Sudden Death Heart disease   Social History: Last updated: 11/02/2009 Married Never Smoked Alcohol use-yes Drug use-no Regular exercise-no  Risk Factors: Exercise: no (11/02/2009)  Risk Factors: Smoking Status: never (11/02/2009)  Family History: Reviewed history from 11/02/2009 and no changes required. Family History of Alcoholism/Addiction Family History Breast cancer 1st degree relative <50 Family History of Stroke F 1st degree relative <60 Family History of Sudden Death Heart disease   Social History: Reviewed history from 11/02/2009 and no changes required. Married Never Smoked Alcohol use-yes Drug use-no Regular exercise-no  Physical Exam  General:  Well-developed,well-nourished,in no acute distress; alert,appropriate and cooperative throughout examination Head:  depression noted over suture line (anterior midline) Eyes:  PERRLA Neck:  Mild cervical LAD Lungs:  Normal respiratory effort, chest expands symmetrically. Lungs are clear to auscultation, no crackles or wheezes. Heart:  Normal rate and regular rhythm. S1 and S2 normal without gallop, murmur, click, rub or other extra sounds. Neurologic:  Unable to state name of practice Teacher, English as a foreign language) states date is 5/10 (today is 5/11)very mild LUE weakness noted on exam. Able to name president and year. DTRs symmetrical and normal.  Affect is somewhat flat and appears mildly confused.  Impression & Recommendations:  Problem # 1:  ALTERED MENTAL STATUS (ICD-780.97) Assessment New Patient with AMS, HA, extreme fatigue.  She drove herself here and is not accompanied by family or friends.  [facility] to rule out CVA, infection or metabolic derangement.  UA here noted small blood.  Will send for culture.    Complete Medication List: 1)  Omeprazole 20 Mg Cpdr (Omeprazole) .... Take 1 tablet by mouth once a  day as needed. 2)  Nasonex 50 Mcg/act Susp (Mometasone furoate) 3)  Symbicort 160-4.5 Mcg/act Aero (Budesonide-formoterol fumarate) .... 2 puffs two times a day 4)  Proair Hfa 108 (90 Base) Mcg/act Aers (Albuterol sulfate) .... 2 puffs qid as needed  Other Orders: Specimen Handling (66063) T-Culture, Urine ([id number])  Current Allergies (reviewed today): ! PENICILLIN ! [facility] ! * TAMIFLU ! SULFA ! FLUZONE (INFLUENZA VIRUS VACCINE SPLIT)  Laboratory Results   Urine Tests    Routine Urinalysis   Color: yellow Appearance: Clear Glucose: negative   (Normal Range: Negative) Bilirubin: negative   (Normal Range: Negative) Ketone: negative   (Normal Range: Negative) Spec. Gravity: <1.005   (Normal Range: 1.003-1.035) Blood: small   (Normal Range: Negative) pH: 6.0   (Normal Range: 5.0-8.0) Protein: trace   (Normal Range: Negative) Urobilinogen: 0.2   (Normal Range: 0-1) Nitrite: negative   (Normal Range: Negative) Leukocyte Esterace: negative   (Normal Range: Negative)

## 2011-01-10 NOTE — Assessment & Plan Note (Signed)
Summary: ER FU /CBS   Vital Signs:  Patient profile:   50 year old female Weight:      225 pounds Pulse rate:   80 / minute Pulse rhythm:   regular BP sitting:   118 / 80  (left arm) Cuff size:   large  Vitals Entered By: Army Fossa CMA (Apr 26, 2010 11:37 AM) CC: Pt here for ER follow up.    History of Present Illness: Pt here to f/u from ER for Headache and fatigue.  Pt found to have sinus infection-- no abx given.  Pt has been taking benadryl and nasonex.   Pt still fatigued but headaches are gone.  + nausea and vomiting yesterday.    No N/V today.  Pt was sent to ER by [facility] because they were concerned about a stroke. ER notes reviewed---- pt dx with chronic sinusitis and bronchitis  ? pneumonia  Current Medications (verified): 1)  Omeprazole 20 Mg Cpdr (Omeprazole) .... Take 1 Tablet By Mouth Once A Day As Needed. 2)  Nasonex 50 Mcg/act Susp (Mometasone Furoate) 3)  Biaxin Xl 500 Mg Xr24h-Tab (Clarithromycin) .... 2 By Mouth Once Daily  Allergies: 1)  ! Penicillin 2)  ! * Maxiquin 3)  ! * Tamiflu 4)  ! Sulfa 5)  ! Fluzone (Influenza Virus Vaccine Split)  Past History:  Past medical, surgical, family and social histories (including risk factors) reviewed for relevance to current acute and chronic problems.  Past Medical History: Reviewed history from 11/02/2009 and no changes required. Anemia GERD Chronic bronchitis Urine incontinence UTI   Past Surgical History: Reviewed history from 11/02/2009 and no changes required. Andoscopy- 2005 Ear bone replacement- 2004  Family History: Reviewed history from 11/02/2009 and no changes required. Family History of Alcoholism/Addiction Family History Breast cancer 1st degree relative <50 Family History of Stroke F 1st degree relative <60 Family History of Sudden Death Heart disease   Social History: Reviewed history from 11/02/2009 and no changes required. Married Never Smoked Alcohol use-yes Drug  use-no Regular exercise-no  Review of Systems      See HPI  Physical Exam  General:  Well-developed,well-nourished,in no acute distress; alert,appropriate and cooperative throughout examination Ears:  External ear exam shows no significant lesions or deformities.  Otoscopic examination reveals clear canals, tympanic membranes are intact bilaterally without bulging, retraction, inflammation or discharge. Hearing is grossly normal bilaterally. Nose:  External nasal examination shows no deformity or inflammation. Nasal mucosa are pink and moist without lesions or exudates. Mouth:  Oral mucosa and oropharynx without lesions or exudates.  Teeth in good repair. Neck:  No deformities, masses, or tenderness noted. Lungs:  Normal respiratory effort, chest expands symmetrically. Lungs are clear to auscultation, no crackles or wheezes. Heart:  Normal rate and regular rhythm. S1 and S2 normal without gallop, murmur, click, rub or other extra sounds. Psych:  Cognition and judgment appear intact. Alert and cooperative with normal attention span and concentration. No apparent delusions, illusions, hallucinations   Impression & Recommendations:  Problem # 1:  ACUTE BRONCHITIS (ICD-466.0)  The following medications were removed from the medication list:    Symbicort 160-4.5 Mcg/act Aero (Budesonide-formoterol fumarate) .Marland Kitchen... 2 puffs two times a day    Proair Hfa 108 (90 Base) Mcg/act Aers (Albuterol sulfate) .Marland Kitchen... 2 puffs qid as needed Her updated medication list for this problem includes:    Biaxin Xl 500 Mg Xr24h-tab (Clarithromycin) .Marland Kitchen... 2 by mouth once daily  Orders: T-2 View CXR (71020TC)  Take antibiotics and other  medications as directed. Encouraged to push clear liquids, get enough rest, and take acetaminophen as needed. To be seen in 5-7 days if no improvement, sooner if worse.  Problem # 2:  OTHER CHRONIC SINUSITIS (ICD-473.8) if symptoms do not resolve--refer to ENT--Dr Jearld Fenton Her  updated medication list for this problem includes:    Nasonex 50 Mcg/act Susp (Mometasone furoate)    Biaxin Xl 500 Mg Xr24h-tab (Clarithromycin) .Marland Kitchen... 2 by mouth once daily  Take antibiotics for full duration. Discussed treatment options including indications for coronal CT scan of sinuses and ENT referral.   Complete Medication List: 1)  Omeprazole 20 Mg Cpdr (Omeprazole) .... Take 1 tablet by mouth once a day as needed. 2)  Nasonex 50 Mcg/act Susp (Mometasone furoate) 3)  Biaxin Xl 500 Mg Xr24h-tab (Clarithromycin) .... 2 by mouth once daily Prescriptions: BIAXIN XL 500 MG XR24H-TAB (CLARITHROMYCIN) 2 by mouth once daily  #28 x 0   Entered and Authorized by:   Loreen Freud DO   Signed by:   Loreen Freud DO on 04/26/2010   Method used:   Electronically to        [facility] (retail)       [address]       [place], [place]  [address]       Ph: [phone]       Fax: [phone]   RxID:   [id number]

## 2011-01-10 NOTE — Assessment & Plan Note (Signed)
Summary: muscle ache and cramps//fd   Vital Signs:  Patient profile:   50 year old female Weight:      231.2 pounds BMI:     33.29 Temp:     98.4 degrees F oral Pulse rate:   72 / minute Resp:     16 per minute BP sitting:   116 / 68  (left arm) Cuff size:   large  Vitals Entered By: Shonna Chock CMA (October 05, 2010 2:41 PM) CC: Muscle aches and cramps x 2 weeks and headaches x couple of days, Headaches   Primary Care Provider:  Laury Axon  CC:  Muscle aches and cramps x 2 weeks and headaches x couple of days and Headaches.  History of Present Illness:      Muscle cramps diffusely & progressive  for past 2 weeks with hands actually cramping  . Rx: none .PMH of vitamin D deficiency & iron deficiency [facility]Labs checked  07/2010 by Dr Myna Hidalgo; iron was lower.HCT was 40.2 ; F/U was to be in 11/2010. Symptoms were profound fatigue, not muscle issues. Headaches      This is a 50 year old woman who presents with Headaches X 2 days.  The patient reports tearing of eyes, but denies nausea, vomiting, sweats, nasal congestion, sinus pressure, photophobia, and phonophobia.  The headache is described as constant.  The location of the pain is bitemporal &  frontally.  The patient denies the following high-risk features: fever, neck pain/stiffness, vision loss or change, focal weakness, altered mental status, and rash.  The headaches have no triggers.  Prior treatment has included acetaminophen with benefit.   No PMH of migraines .  Allergies: 1)  ! Penicillin 2)  ! * Maxiquin 3)  ! * Tamiflu 4)  ! Sulfa 5)  ! Fluzone (Influenza Virus Vaccine Split)  Review of Systems General:  Denies chills and sweats. Eyes:  Denies blurring, double vision, and vision loss-both eyes. ENT:  Denies difficulty swallowing and hoarseness. MS:  Denies joint pain, joint redness, and joint swelling.  Physical Exam  General:  well-nourished,in no acute distress;  alert,appropriate and cooperative throughout examination Eyes:  No corneal or conjunctival inflammation noted. EOMI. Perrla. Field of Vision grossly normal. Ears:  External ear exam shows no significant lesions or deformities.  Otoscopic examination reveals clear canals, tympanic membranes are intact bilaterally without bulging, retraction, inflammation or discharge. Hearing is grossly normal bilaterally. Scar R TM Nose:  External nasal examination shows no deformity or inflammation. Nasal mucosa are pink and moist without lesions or exudates. Mouth:  Oral mucosa and oropharynx without lesions or exudates.  Teeth in good repair. No tongue deviation Lungs:  Normal respiratory effort, chest expands symmetrically. Lungs are clear to auscultation, no crackles or wheezes. Heart:  Normal rate and regular rhythm. S1 and S2 normal without gallop, murmur, click, rub. S4 Extremities:  No clubbing, cyanosis, edema. Neg SLR . Neg Homan'  Neurologic:  alert & oriented X3, cranial nerves II-XII intact, strength normal in all extremities, sensation  ? inhanced R > L face  to light touch, heel/ toe gait normal, DTRs symmetrical and normal, finger-to-nose normal, and Romberg negative.   Skin:  Intact without suspicious lesions or rashes Cervical Nodes:  No lymphadenopathy noted Axillary Nodes:  No palpable lymphadenopathy Psych:  memory intact for recent and remote, normally interactive, and good eye contact.     Impression & Recommendations:  Problem # 1:  MUSCLE PAIN (ICD-729.1)  with  cramping & drawing  Her updated medication list for this problem includes:    Vicodin Es 7.5-750 Mg Tabs (Hydrocodone-acetaminophen) .Marland Kitchen... 1 by mouth q6h as needed    Hydrocodone-acetaminophen 5-500 Mg Tabs (Hydrocodone-acetaminophen) .Marland Kitchen... 4-6 hours as needed    Tramadol Hcl 50 Mg Tabs (Tramadol hcl) .Marland Kitchen... 1 every 6 hrs as needed pain , headache  Orders: Venipuncture (81191) TLB-TSH (Thyroid Stimulating Hormone)  (84443-TSH) TLB-Calcium (82310-CA) TLB-Potassium (K+) (84132-K) TLB-CK Total Only(Creatine Kinase/CPK) (82550-CK) TLB-Magnesium (Mg) (83735-MG) T-Vitamin D (25-Hydroxy) (47829-56213)  Problem # 2:  HEADACHE (ICD-784.0) relieved  by Tylenol Her updated medication list for this problem includes:    Vicodin Es 7.5-750 Mg Tabs (Hydrocodone-acetaminophen) .Marland Kitchen... 1 by mouth q6h as needed    Hydrocodone-acetaminophen 5-500 Mg Tabs (Hydrocodone-acetaminophen) .Marland Kitchen... 4-6 hours as needed    Tramadol Hcl 50 Mg Tabs (Tramadol hcl) .Marland Kitchen... 1 every 6 hrs as needed pain , headache  Problem # 3:  VITAMIN D DEFICIENCY (ICD-268.9)  Orders: Venipuncture (08657) T-Vitamin D (25-Hydroxy) (84696-29528)  Problem # 4:  IRON DEFICIENCY (ICD-280.9)  Orders: Venipuncture (41324) TLB-CBC Platelet - w/Differential (85025-CBCD) TLB-IBC Pnl (Iron/FE;Transferrin) (83550-IBC)  Complete Medication List: 1)  Omeprazole 20 Mg Cpdr (Omeprazole) .... Take 1 tablet by mouth once a day as needed. 2)  Nasonex 50 Mcg/act Susp (Mometasone furoate) 3)  Vicodin Es 7.5-750 Mg Tabs (Hydrocodone-acetaminophen) .Marland Kitchen.. 1 by mouth q6h as needed 4)  Doxycycline Hyclate 100 Mg Caps (Doxycycline hyclate) .... 2 by mouth x 1 day then 1 by mouth once daily til rx complete 5)  Hydrocodone-acetaminophen 5-500 Mg Tabs (Hydrocodone-acetaminophen) .... 4-6 hours as needed 6)  Tramadol Hcl 50 Mg Tabs (Tramadol hcl) .Marland Kitchen.. 1 every 6 hrs as needed pain , headache  Patient Instructions: 1)  Keep Headache  Diary  as discussed; Tramadol if unrelieved by Tylenol. Prescriptions: TRAMADOL HCL 50 MG TABS (TRAMADOL HCL) 1 every 6 hrs as needed pain , headache  #30 x 0   Entered and Authorized by:   Marga Melnick MD   Signed by:   Marga Melnick MD on 10/05/2010   Method used:   Print then Give to Patient   RxID:   [id number]    Orders Added: 1)  Est. Patient Level IV [74259] 2)  Venipuncture [56387] 3)  TLB-CBC Platelet - w/Differential  [85025-CBCD] 4)  TLB-IBC Pnl (Iron/FE;Transferrin) [83550-IBC] 5)  TLB-TSH (Thyroid Stimulating Hormone) [84443-TSH] 6)  TLB-Calcium [82310-CA] 7)  TLB-Potassium (K+) [84132-K] 8)  TLB-CK Total Only(Creatine Kinase/CPK) [82550-CK] 9)  TLB-Magnesium (Mg) [83735-MG] 10)  T-Vitamin D (25-Hydroxy) [56433-29518]  Appended Document: muscle ache and cramps//fd

## 2011-01-10 NOTE — Assessment & Plan Note (Signed)
Summary: not feeling well, ha, sore throat- Amber Hebert   Vital Signs:  Patient profile:   50 year old female Weight:      240 pounds Temp:     98.3 degrees F oral Pulse rate:   82 / minute Pulse rhythm:   regular BP sitting:   126 / 84  (left arm) Cuff size:   regular  Vitals Entered By: Army Fossa CMA (January 06, 2010 10:59 AM) CC: Pt c/o headache, chest congestion, sore throat, fever, headache x 3 days. , URI symptoms   History of Present Illness:       This is a 50 year old woman who presents with URI symptoms.  The patient complains of nasal congestion, purulent nasal discharge, productive cough, and earache, but denies clear nasal discharge, sore throat, and sick contacts.  Associated symptoms include fever of 100.5-103 degrees.  The patient denies itchy watery eyes, itchy throat, sneezing, seasonal symptoms, response to antihistamine, headache, muscle aches, and severe fatigue.  The patient denies the following risk factors for Strep sinusitis: unilateral facial pain, unilateral nasal discharge, poor response to decongestant, double sickening, tooth pain, Strep exposure, tender adenopathy, and absence of cough.    Current Medications (verified): 1)  Nexium 20 Mg Cpdr (Esomeprazole Magnesium) .Marland Kitchen.. 1 By Mouth Daily. 2)  Iron 25 Mg Tabs (Iron) .Marland Kitchen.. 1 By Mouth Three Times A Day 3)  Biaxin Xl 500 Mg Xr24h-Tab (Clarithromycin) .... 2 By Mouth Once Daily 4)  Cheratussin Ac 100-10 Mg/67ml Syrp (Guaifenesin-Codeine) .Marland Kitchen.. 1-2 Tsp By Mouth At Bedtime 5)  Nasonex 50 Mcg/act Susp (Mometasone Furoate)  Allergies: 1)  ! Penicillin 2)  ! * Maxiquin 3)  ! * Tamiflu 4)  ! Sulfa 5)  ! Fluzone (Influenza Virus Vaccine Split)  Past History:  Past Medical History: Last updated: 11/02/2009 Anemia GERD Chronic bronchitis Urine incontinence UTI   Past Surgical History: Last updated: 11/02/2009 Andoscopy- 2005 Ear bone replacement- 2004  Family History: Last updated: 11/02/2009 Family  History of Alcoholism/Addiction Family History Breast cancer 1st degree relative <50 Family History of Stroke F 1st degree relative <60 Family History of Sudden Death Heart disease   Social History: Last updated: 11/02/2009 Married Never Smoked Alcohol use-yes Drug use-no Regular exercise-no  Risk Factors: Exercise: no (11/02/2009)  Risk Factors: Smoking Status: never (11/02/2009)  Family History: Reviewed history from 11/02/2009 and no changes required. Family History of Alcoholism/Addiction Family History Breast cancer 1st degree relative <50 Family History of Stroke F 1st degree relative <60 Family History of Sudden Death Heart disease   Social History: Reviewed history from 11/02/2009 and no changes required. Married Never Smoked Alcohol use-yes Drug use-no Regular exercise-no  Review of Systems      See HPI  Physical Exam  General:  Well-developed,well-nourished,in no acute distress; alert,appropriate and cooperative throughout examination Ears:  External ear exam shows no significant lesions or deformities.  Otoscopic examination reveals clear canals, tympanic membranes are intact bilaterally without bulging, retraction, inflammation or discharge. Hearing is grossly normal bilaterally. Nose:  External nasal examination shows no deformity or inflammation. Nasal mucosa are pink and moist without lesions or exudates. Mouth:  no exudates and pharyngeal erythema.   Neck:  No deformities, masses, or tenderness noted. Lungs:  Normal respiratory effort, chest expands symmetrically. Lungs are clear to auscultation, no crackles or wheezes. Heart:  Normal rate and regular rhythm. S1 and S2 normal without gallop, murmur, click, rub or other extra sounds. Extremities:  No clubbing, cyanosis, edema, or deformity noted with  normal full range of motion of all joints.   Skin:  Intact without suspicious lesions or rashes Cervical Nodes:  No lymphadenopathy noted Psych:   Cognition and judgment appear intact. Alert and cooperative with normal attention span and concentration. No apparent delusions, illusions, hallucinations   Impression & Recommendations:  Problem # 1:  SINUSITIS - ACUTE-NOS (ICD-461.9)  Her updated medication list for this problem includes:    Biaxin Xl 500 Mg Xr24h-tab (Clarithromycin) .Marland Kitchen... 2 by mouth once daily    Cheratussin Ac 100-10 Mg/32ml Syrp (Guaifenesin-codeine) .Marland Kitchen... 1-2 tsp by mouth at bedtime    Nasonex 50 Mcg/act Susp (Mometasone furoate)  Instructed on treatment. Call if symptoms persist or worsen.   Problem # 2:  LOM (ICD-382.9)  Her updated medication list for this problem includes:    Biaxin Xl 500 Mg Xr24h-tab (Clarithromycin) .Marland Kitchen... 2 by mouth once daily  Instructed on prevention and treatment. Call if no improvement in 48-72 hours or sooner if worsening symptoms.   Complete Medication List: 1)  Nexium 20 Mg Cpdr (Esomeprazole magnesium) .Marland Kitchen.. 1 by mouth daily. 2)  Iron 25 Mg Tabs (Iron) .Marland Kitchen.. 1 by mouth three times a day 3)  Biaxin Xl 500 Mg Xr24h-tab (Clarithromycin) .... 2 by mouth once daily 4)  Cheratussin Ac 100-10 Mg/67ml Syrp (Guaifenesin-codeine) .Marland Kitchen.. 1-2 tsp by mouth at bedtime 5)  Nasonex 50 Mcg/act Susp (Mometasone furoate) Prescriptions: CHERATUSSIN AC 100-10 MG/5ML SYRP (GUAIFENESIN-CODEINE) 1-2 tsp by mouth at bedtime  #6 oz x 0   Entered and Authorized by:   Loreen Freud DO   Signed by:   Loreen Freud DO on 01/06/2010   Method used:   Print then Give to Patient   RxID:   [id number] BIAXIN XL 500 MG XR24H-TAB (CLARITHROMYCIN) 2 by mouth once daily  #28 x 0   Entered and Authorized by:   Loreen Freud DO   Signed by:   Loreen Freud DO on 01/06/2010   Method used:   Electronically to        [facility] (retail)       [address]       [place], [place]  [address]       Ph: [phone]       Fax: [phone]   RxID:   [id number]   Laboratory Results      Other Tests  Rapid Strep: negative Influenza A: negative Comments: Army Fossa CMA  January 06, 2010 11:26 AM

## 2011-01-10 NOTE — Letter (Signed)
Summary: Out of Work  [facility] [employer]  [address] [place], [place] [address]   Phone: [phone]  Fax: [phone]    Apr 27, 2010   Employee:  AFSANA LIERA    To Whom It May Concern:   For Medical reasons, please excuse the above named employee from work for the following dates:  Start:   May 17,2011  End:   Apr 28, 2010  If you need additional information, please feel free to contact our office.         Sincerely,    Loreen Freud, DO

## 2011-01-10 NOTE — Assessment & Plan Note (Signed)
Summary: HURT KNEE/KNOT ON THROAT//KN   Vital Signs:  Patient profile:   50 year old female Weight:      236.6 pounds Temp:     98.3 degrees F oral Pulse rate:   80 / minute Pulse rhythm:   regular BP sitting:   120 / 82  (right arm)  Vitals Entered By: Almeta Monas CMA Duncan Dull) (August 23, 2010 3:19 PM) CC: c/o pain from the hip down to the ankle, denies injury and a sore throat   History of Present Illness:  Injury      This is a 50 year old woman who presents with An injury.  The symptoms began <4 hrs ago.  The patient reports injury to the right knee, but denies injury to the head, face, neck, left arm, right arm, left elbow, right elbow, left forearm, right forearm, chest, back, abdomen, left hip, right hip, left thigh, right thigh, left knee, left leg, right leg, left ankle, right ankle, left foot, and right foot.  The patient also reports swelling and tenderness.  The patient denies redness, increased warmth deformity, blood loss, numbness, weakness, loss of sensation, coolness of extremity, and loss of consciousness.  The patient denies the following risk factors for significant bleeding: aspirin use, anticoagulant use, and history of bleeding disorder.  Screening for risk of abuse was negative.  Pt states R knee gave out on her walking down steps.    Current Medications (verified): 1)  Omeprazole 20 Mg Cpdr (Omeprazole) .... Take 1 Tablet By Mouth Once A Day As Needed. 2)  Nasonex 50 Mcg/act Susp (Mometasone Furoate) 3)  Vicodin Es 7.5-750 Mg Tabs (Hydrocodone-Acetaminophen) .Marland Kitchen.. 1 By Mouth Q6h As Needed  Allergies (verified): 1)  ! Penicillin 2)  ! * Maxiquin 3)  ! * Tamiflu 4)  ! Sulfa 5)  ! Fluzone (Influenza Virus Vaccine Split)  Past History:  Past medical, surgical, family and social histories (including risk factors) reviewed for relevance to current acute and chronic problems.  Past Medical History: Reviewed history from 11/02/2009 and no changes  required. Anemia GERD Chronic bronchitis Urine incontinence UTI   Past Surgical History: Reviewed history from 11/02/2009 and no changes required. Andoscopy- 2005 Ear bone replacement- 2004  Family History: Reviewed history from 11/02/2009 and no changes required. Family History of Alcoholism/Addiction Family History Breast cancer 1st degree relative <50 Family History of Stroke F 1st degree relative <60 Family History of Sudden Death Heart disease   Social History: Reviewed history from 11/02/2009 and no changes required. Married Never Smoked Alcohol use-yes Drug use-no Regular exercise-no  Review of Systems      See HPI  Physical Exam  General:  Well-developed,well-nourished,in no acute distress; alert,appropriate and cooperative throughout examination Ears:  External ear exam shows no significant lesions or deformities.  Otoscopic examination reveals clear canals, tympanic membranes are intact bilaterally without bulging, retraction, inflammation or discharge. Hearing is grossly normal bilaterally. Nose:  External nasal examination shows no deformity or inflammation. Nasal mucosa are pink and moist without lesions or exudates. Mouth:  pharyngeal erythema and postnasal drip.   Neck:  No deformities, masses, or tenderness noted. Msk:  R knee---decreased ROM, joint tenderness, and joint swelling.   Skin:  Intact without suspicious lesions or rashes Psych:  Cognition and judgment appear intact. Alert and cooperative with normal attention span and concentration. No apparent delusions, illusions, hallucinations   Impression & Recommendations:  Problem # 1:  PHARYNGITIS-ACUTE (ICD-462) otc antihistamine drink plenty of fluids call or rto if  no better in 5-7 days Orders: Rapid Strep (95621) T-Culture, Throat (30865-78469)  Problem # 2:  KNEE PAIN, RIGHT (ICD-719.46)  Her updated medication list for this problem includes:    Vicodin Es 7.5-750 Mg Tabs  (Hydrocodone-acetaminophen) .Marland Kitchen... 1 by mouth q6h as needed  Orders: T-Knee Comp Right 4 Views (73564TC) Rapid Strep (62952)  Complete Medication List: 1)  Omeprazole 20 Mg Cpdr (Omeprazole) .... Take 1 tablet by mouth once a day as needed. 2)  Nasonex 50 Mcg/act Susp (Mometasone furoate) 3)  Vicodin Es 7.5-750 Mg Tabs (Hydrocodone-acetaminophen) .Marland Kitchen.. 1 by mouth q6h as needed  Patient Instructions: 1)  Get plenty of rest, drink lots of clear liquids, and use Tylenol or Ibuprofen for fever and comfort. Return in 7-10 days if you're not better: sooner if you'er feeling worse.  2)  You may move around but avoid painful motions. Apply ice to sore area for 20 minutes 3-4 times a day for 2-3 days.  Prescriptions: VICODIN ES 7.5-750 MG TABS (HYDROCODONE-ACETAMINOPHEN) 1 by mouth q6h as needed  #30 x 0   Entered and Authorized by:   Loreen Freud DO   Signed by:   Loreen Freud DO on 08/23/2010   Method used:   Print then Give to Patient   RxID:   [id number]   Laboratory Results    Other Tests  Rapid Strep: negative

## 2011-01-10 NOTE — Letter (Signed)
[facility]   Imported By: Lanelle Bal 04/22/2010 11:07:20  _____________________________________________________________________  External Attachment:    Type:   Image     Comment:   External Document

## 2011-01-12 NOTE — Assessment & Plan Note (Signed)
Summary: weak and tired/nta   Vital Signs:  Patient profile:   50 year old female Weight:      235.6 pounds BMI:     33.93 Temp:     98.1 degrees F oral Pulse rate:   72 / minute Resp:     14 per minute BP sitting:   104 / 70  (right arm) Cuff size:   large  Vitals Entered By: Shonna Chock CMA (December 28, 2010 4:35 PM) CC: Weak, fatigue, runny nose, chest feels heavy: ? pneumonia or bronchitis, URI symptoms   Primary Care Provider:  [facility]  CC:  Weak, fatigue, runny nose, chest feels heavy: ? pneumonia or bronchitis, and URI symptoms.  History of Present Illness:    Onset 12/27/2010 as rhinitis & malaise; she is concerned  about illness which might impact  elective knee surgery  scheduled 03/14.  The patient now  reports  scant purulent nasal discharge and productive cough with minimal sputum, but she  denies nasal congestion and sore throat.  The patient denies fever, dyspnea, and wheezing.  The patient denies headache & bilateral facial pain, tooth pain, and tender adenopathy.  Rx: Nyquil. Non smoker; no PMH of asthma. No Flu shot as allergic to shot; she is also allergic to Tamiflu.  Current Medications (verified): 1)  Omeprazole 20 Mg Cpdr (Omeprazole) .... Take 1 Tablet By Mouth Once A Day As Needed. 2)  Nasonex 50 Mcg/act Susp (Mometasone Furoate) 3)  Hydrocodone-Acetaminophen 5-500 Mg Tabs (Hydrocodone-Acetaminophen) .... 4-6 Hours As Needed 4)  Tramadol Hcl 50 Mg Tabs (Tramadol Hcl) .Marland Kitchen.. 1 Every 6 Hrs As Needed Pain , Headache  Allergies: 1)  ! Penicillin 2)  ! * Maxiquin 3)  ! * Tamiflu 4)  ! Sulfa 5)  ! Fluzone (Influenza Virus Vaccine Split)  Review of Systems MS:  Denies joint redness, joint swelling, and muscle aches; Only joint pain is knee .  Physical Exam  General:  Appears fatigued but in no acute distress;  cooperative throughout examination Ears:  External ear exam shows no significant lesions or deformities.  Otoscopic examination reveals clear canals,  tympanic membranes are intact bilaterally without bulging, retraction, inflammation or discharge. Hearing is grossly normal bilaterally. Minor scar R TM Nose:  External nasal examination shows no deformity or inflammation. Nasal mucosa are pink and moist without lesions or exudates. Mouth:  Oral mucosa and oropharynx without lesions or exudates.  Teeth in good repair. Lungs:  Normal respiratory effort, chest expands symmetrically. Lungs are clear to auscultation, no crackles or wheezes. Decreased BS Heart:  Normal rate and regular rhythm. S1 and S2 normal without gallop, murmur, click, rub or other extra sounds. Cervical Nodes:  No lymphadenopathy noted Axillary Nodes:  No palpable lymphadenopathy   Impression & Recommendations:  Problem # 1:  MALAISE AND FATIGUE (ICD-780.79)  Problem # 2:  URI (ICD-465.9)  Complete Medication List: 1)  Omeprazole 20 Mg Cpdr (Omeprazole) .... Take 1 tablet by mouth once a day as needed. 2)  Nasonex 50 Mcg/act Susp (Mometasone furoate) 3)  Hydrocodone-acetaminophen 5-500 Mg Tabs (Hydrocodone-acetaminophen) .... 4-6 hours as needed 4)  Tramadol Hcl 50 Mg Tabs (Tramadol hcl) .Marland Kitchen.. 1 every 6 hrs as needed pain , headache  Patient Instructions: 1)  Neti pot once daily two times a day as needed for head congestion. 2)  Drink as much  NON dairy fluid as you can tolerate for the next few days. Echincea, vit C 2000 mg once daily , & Zicam  or  Zinc. Report  pain , pus  & fever. CBC & dif  if symptoms persist.   Orders Added: 1)  Est. Patient Level III [08657]

## 2011-01-12 NOTE — Letter (Signed)
Summary:  Cancer Center  [facility]   Imported By: Lanelle Bal 12/13/2010 12:02:50  _____________________________________________________________________  External Attachment:    Type:   Image     Comment:   External Document

## 2011-01-13 ENCOUNTER — Telehealth: Payer: Self-pay | Admitting: Internal Medicine

## 2011-01-18 NOTE — Progress Notes (Signed)
Summary: RX request  Phone Note Call from Patient Call back at Home Phone [phone]   Summary of Call: Patient called noting that her infection has become full blown. She has stuffy head, congestion, and aching all over. Patient would like prescription called in. Please advise. Initial call taken by: Lucious Groves CMA,  January 13, 2011 4:42 PM  Follow-up for Phone Call        Patient notified of the below prescription.  Follow-up by: Lucious Groves CMA,  January 13, 2011 5:04 PM    New/Updated Medications: VIBRAMYCIN 100 MG CAPS (DOXYCYCLINE HYCLATE) 1 by mouth two times a day **AVOID SUN WHILE TAKING** Prescriptions: VIBRAMYCIN 100 MG CAPS (DOXYCYCLINE HYCLATE) 1 by mouth two times a day **AVOID SUN WHILE TAKING**  #14 x 0   Entered by:   Lucious Groves CMA   Authorized by:   Marga Melnick MD   Signed by:   Lucious Groves CMA on 01/13/2011   Method used:   Electronically to        [facility] (retail)       [address]       [place], [place]  [address]       Ph: [phone]       Fax: [phone]   RxID:   [id number]

## 2011-02-08 ENCOUNTER — Ambulatory Visit (INDEPENDENT_AMBULATORY_CARE_PROVIDER_SITE_OTHER): Payer: 59 | Admitting: Family Medicine

## 2011-02-08 ENCOUNTER — Encounter: Payer: Self-pay | Admitting: Family Medicine

## 2011-02-08 DIAGNOSIS — L509 Urticaria, unspecified: Secondary | ICD-10-CM

## 2011-02-09 ENCOUNTER — Ambulatory Visit: Payer: 59 | Admitting: Family Medicine

## 2011-02-14 ENCOUNTER — Ambulatory Visit (INDEPENDENT_AMBULATORY_CARE_PROVIDER_SITE_OTHER): Payer: 59 | Admitting: Family Medicine

## 2011-02-14 ENCOUNTER — Encounter: Payer: Self-pay | Admitting: Family Medicine

## 2011-02-14 ENCOUNTER — Other Ambulatory Visit: Payer: Self-pay | Admitting: Family Medicine

## 2011-02-14 DIAGNOSIS — L259 Unspecified contact dermatitis, unspecified cause: Secondary | ICD-10-CM

## 2011-02-15 LAB — CBC WITH DIFFERENTIAL/PLATELET
Basophils Relative: 0.5 % (ref 0.0–3.0)
Eosinophils Relative: 0.2 % (ref 0.0–5.0)
HCT: 42.3 % (ref 36.0–46.0)
Lymphs Abs: 1.9 10*3/uL (ref 0.7–4.0)
Monocytes Relative: 5.1 % (ref 3.0–12.0)
Platelets: 375 10*3/uL (ref 150.0–400.0)
RBC: 4.54 Mil/uL (ref 3.87–5.11)
WBC: 5.4 10*3/uL (ref 4.5–10.5)

## 2011-02-15 LAB — SEDIMENTATION RATE: Sed Rate: 12 mm/hr (ref 0–22)

## 2011-02-15 LAB — HEPATIC FUNCTION PANEL
ALT: 21 U/L (ref 0–35)
AST: 15 U/L (ref 0–37)
Bilirubin, Direct: 0.1 mg/dL (ref 0.0–0.3)
Total Bilirubin: 0.2 mg/dL — ABNORMAL LOW (ref 0.3–1.2)

## 2011-02-15 LAB — BASIC METABOLIC PANEL
BUN: 16 mg/dL (ref 6–23)
GFR: 65.31 mL/min (ref >60.00)
Potassium: 4.1 mEq/L (ref 3.5–5.1)
Sodium: 139 mEq/L (ref 135–145)

## 2011-02-16 ENCOUNTER — Encounter (HOSPITAL_COMMUNITY)
Admission: RE | Admit: 2011-02-16 | Discharge: 2011-02-16 | Disposition: A | Discharge status: Home / Self Care | Payer: 59 | Source: Ambulatory Visit | Attending: Orthopedic Surgery | Admitting: Orthopedic Surgery

## 2011-02-16 ENCOUNTER — Other Ambulatory Visit (HOSPITAL_COMMUNITY): Payer: Self-pay | Admitting: Orthopedic Surgery

## 2011-02-16 ENCOUNTER — Ambulatory Visit (HOSPITAL_COMMUNITY)
Admission: RE | Admit: 2011-02-16 | Discharge: 2011-02-16 | Disposition: A | Discharge status: Home / Self Care | Payer: 59 | Source: Ambulatory Visit | Attending: Orthopedic Surgery | Admitting: Orthopedic Surgery

## 2011-02-16 DIAGNOSIS — M1710 Unilateral primary osteoarthritis, unspecified knee: Secondary | ICD-10-CM

## 2011-02-16 DIAGNOSIS — Z01812 Encounter for preprocedural laboratory examination: Secondary | ICD-10-CM | POA: Insufficient documentation

## 2011-02-16 DIAGNOSIS — Z01818 Encounter for other preprocedural examination: Secondary | ICD-10-CM | POA: Insufficient documentation

## 2011-02-16 DIAGNOSIS — IMO0002 Reserved for concepts with insufficient information to code with codable children: Secondary | ICD-10-CM | POA: Insufficient documentation

## 2011-02-16 LAB — CBC
Hemoglobin: 13.8 g/dL (ref 12.0–15.0)
MCH: 30.1 pg (ref 26.0–34.0)
RBC: 4.59 MIL/uL (ref 3.87–5.11)

## 2011-02-16 LAB — PROTIME-INR
INR: 0.87 (ref 0.00–1.49)
Prothrombin Time: 12 seconds (ref 11.6–15.2)

## 2011-02-16 LAB — URINALYSIS, ROUTINE W REFLEX MICROSCOPIC
Bilirubin Urine: NEGATIVE
Glucose, UA: NEGATIVE mg/dL
Hgb urine dipstick: NEGATIVE
Ketones, ur: NEGATIVE mg/dL
Protein, ur: NEGATIVE mg/dL
pH: 6.5 (ref 5.0–8.0)

## 2011-02-16 LAB — COMPREHENSIVE METABOLIC PANEL
Albumin: 3.7 g/dL (ref 3.5–5.2)
Alkaline Phosphatase: 61 U/L (ref 39–117)
BUN: 16 mg/dL (ref 6–23)
CO2: 29 mEq/L (ref 19–32)
Chloride: 103 mEq/L (ref 96–112)
GFR calc non Af Amer: >60 mL/min (ref >60)
Glucose, Bld: 108 mg/dL — ABNORMAL HIGH (ref 70–99)
Potassium: 4.4 mEq/L (ref 3.5–5.1)
Total Bilirubin: 0.5 mg/dL (ref 0.3–1.2)

## 2011-02-16 LAB — SURGICAL PCR SCREEN: MRSA, PCR: NEGATIVE

## 2011-02-16 NOTE — Assessment & Plan Note (Signed)
Summary: ALLERGIC REACTION/RH...   Vital Signs:  Patient profile:   50 year old female Weight:      237.2 pounds Pulse rate:   80 / minute Pulse rhythm:   regular BP sitting:   90 / 60  (left arm) Cuff size:   large  Vitals Entered By: Almeta Monas CMA Duncan Dull) (February 08, 2011 9:44 AM) CC: X 2 weeks c/o rash all over the body that itches---thinks she has had a allergice reaction, Rash   History of Present Illness:  Rash      This is a 50 year old woman who presents with Rash.  The symptoms began 2 weeks ago.  Rash started on thighs and then spread .  The patient complains of hives, but denies macules, papules, nodules, welts, pustules, blisters, ulcers, itching, scaling, weeping, oozing, redness, increased warmth, and tenderness.  The rash is located on the neck and face/trunk/limbs diffusely.  The rash is worse with heat, better with topical steroids, and better with H1 blockers.  The patient denies the following symptoms: fever, headache, facial swelling, tongue swelling, burning, difficulty breathing, abdominal pain, nausea, vomiting, diarrhea, dizziness, sore throat, dysuria, eye symptoms, arthralgias, and vaginal discharge.  The patient denies history of recent tick bite, recent tick exposure, other insect bite, recent infection, recent antibiotic use, new medication, new clothing, new topical exposure, recent travel, pet/animal contact, thyroid disease, chronic liver disease, autoimmune disease, chronic edema, and prior STD.    Current Medications (verified): 1)  Omeprazole 20 Mg Cpdr (Omeprazole) .... Take 1 Tablet By Mouth Once A Day As Needed. 2)  Nasonex 50 Mcg/act Susp (Mometasone Furoate) 3)  Hydrocodone-Acetaminophen 5-500 Mg Tabs (Hydrocodone-Acetaminophen) .... 4-6 Hours As Needed 4)  Tramadol Hcl 50 Mg Tabs (Tramadol Hcl) .Marland Kitchen.. 1 Every 6 Hrs As Needed Pain , Headache 5)  Prednisone 10 Mg Tabs (Prednisone) .... 3 By Mouth Once Daily For 3 Days Then 2 By Mouth Once Daily For  3 Days Then 1 By Mouth Once Daily For 3 Days  Allergies (verified): 1)  ! Penicillin 2)  ! * Maxiquin 3)  ! * Tamiflu 4)  ! Sulfa 5)  ! Fluzone (Influenza Virus Vaccine Split)  Past History:  Past medical, surgical, family and social histories (including risk factors) reviewed for relevance to current acute and chronic problems.  Past Medical History: Reviewed history from 11/02/2009 and no changes required. Anemia GERD Chronic bronchitis Urine incontinence UTI   Past Surgical History: Reviewed history from 11/02/2009 and no changes required. Andoscopy- 2005 Ear bone replacement- 2004  Family History: Reviewed history from 11/02/2009 and no changes required. Family History of Alcoholism/Addiction Family History Breast cancer 1st degree relative <50 Family History of Stroke F 1st degree relative <60 Family History of Sudden Death Heart disease   Social History: Reviewed history from 11/02/2009 and no changes required. Married Never Smoked Alcohol use-yes Drug use-no Regular exercise-no  Review of Systems      See HPI  Physical Exam  General:  Well-developed,well-nourished,in no acute distress; alert,appropriate and cooperative throughout examination Skin:  hives and errythema chest , neck abd, back and legs  Cervical Nodes:  No lymphadenopathy noted Psych:  Oriented X3 and normally interactive.     Impression & Recommendations:  Problem # 1:  URTICARIA (ICD-708.9)  prednisone taper  con't antihistamine  Orders: Admin of Therapeutic Inj  intramuscular or subcutaneous (81191) Depo- Medrol 80mg  (J1040)  Complete Medication List: 1)  Omeprazole 20 Mg Cpdr (Omeprazole) .... Take 1 tablet by mouth  once a day as needed. 2)  Nasonex 50 Mcg/act Susp (Mometasone furoate) 3)  Hydrocodone-acetaminophen 5-500 Mg Tabs (Hydrocodone-acetaminophen) .... 4-6 hours as needed 4)  Tramadol Hcl 50 Mg Tabs (Tramadol hcl) .Marland Kitchen.. 1 every 6 hrs as needed pain , headache 5)   Prednisone 10 Mg Tabs (Prednisone) .... 3 by mouth once daily for 3 days then 2 by mouth once daily for 3 days then 1 by mouth once daily for 3 days Prescriptions: PREDNISONE 10 MG TABS (PREDNISONE) 3 by mouth once daily for 3 days then 2 by mouth once daily for 3 days then 1 by mouth once daily for 3 days  #18 x 0   Entered and Authorized by:   Loreen Freud DO   Signed by:   Loreen Freud DO on 02/08/2011   Method used:   Electronically to        [facility] (retail)       [address]       [place], [place]  [address]       Ph: [phone]       Fax: [phone]   RxID:   [id number]    Medication Administration  Injection # 1:    Medication: Depo- Medrol 80mg     Diagnosis: URTICARIA (ICD-708.9)    Route: IM    Site: RUOQ gluteus    Exp Date: 06/11/2011    Lot #: [id number]    Mfr: Pharmacia    Patient tolerated injection without complications    Given by: Almeta Monas CMA Duncan Dull) (February 08, 2011 10:43 AM)  Orders Added: 1)  Est. Patient Level III [99213] 2)  Admin of Therapeutic Inj  intramuscular or subcutaneous [96372] 3)  Depo- Medrol 80mg  [J1040]   Immunization History:  Tetanus/Td Immunization History:    Tetanus/Td:  historical (12/11/2004)   Immunization History:  Tetanus/Td Immunization History:    Tetanus/Td:  Historical (12/11/2004)

## 2011-02-21 NOTE — Assessment & Plan Note (Signed)
Summary: f/u reaction/cbs   Vital Signs:  Patient profile:   50 year old female Pulse rate:   80 / minute Pulse rhythm:   regular BP sitting:   120 / 70  (left arm) Cuff size:   large  Vitals Entered By: Almeta Monas CMA Duncan Dull) (February 14, 2011 3:19 PM) CC: f/u on rash----not much better   History of Present Illness: Pt here c/o rash no better.  It did improve for a few days but when prednisone does decreased rash came back and is very itchy.  see last ov.  Current Medications (verified): 1)  Omeprazole 20 Mg Cpdr (Omeprazole) .... Take 1 Tablet By Mouth Once A Day As Needed. 2)  Nasonex 50 Mcg/act Susp (Mometasone Furoate) 3)  Hydrocodone-Acetaminophen 5-500 Mg Tabs (Hydrocodone-Acetaminophen) .... 4-6 Hours As Needed 4)  Tramadol Hcl 50 Mg Tabs (Tramadol Hcl) .Marland Kitchen.. 1 Every 6 Hrs As Needed Pain , Headache 5)  Prednisone 10 Mg Tabs (Prednisone) .... 3 By Mouth Once Daily For 3 Days Then 2 By Mouth Once Daily For 3 Days Then 1 By Mouth Once Daily For 3 Days  Allergies (verified): 1)  ! Penicillin 2)  ! * Maxiquin 3)  ! * Tamiflu 4)  ! Sulfa 5)  ! Fluzone (Influenza Virus Vaccine Split)  Past History:  Past medical, surgical, family and social histories (including risk factors) reviewed for relevance to current acute and chronic problems.  Past Medical History: Reviewed history from 11/02/2009 and no changes required. Anemia GERD Chronic bronchitis Urine incontinence UTI   Past Surgical History: Reviewed history from 11/02/2009 and no changes required. Andoscopy- 2005 Ear bone replacement- 2004  Family History: Reviewed history from 11/02/2009 and no changes required. Family History of Alcoholism/Addiction Family History Breast cancer 1st degree relative <50 Family History of Stroke F 1st degree relative <60 Family History of Sudden Death Heart disease   Social History: Reviewed history from 11/02/2009 and no changes required. Married Never Smoked Alcohol  use-yes Drug use-no Regular exercise-no  Review of Systems      See HPI  Physical Exam  General:  Well-developed,well-nourished,in no acute distress; alert,appropriate and cooperative throughout examination Skin:  papules on arms, legs, back and abd Psych:  Oriented X3 and normally interactive.     Impression & Recommendations:  Problem # 1:  SKIN RASH, ALLERGIC (ICD-692.9)  Her updated medication list for this problem includes:    Prednisone 10 Mg Tabs (Prednisone) .Marland KitchenMarland KitchenMarland KitchenMarland Kitchen 3 by mouth once daily for 3 days then 2 by mouth once daily for 3 days then 1 by mouth once daily for 3 days  Orders: Venipuncture (96045) TLB-BMP (Basic Metabolic Panel-BMET) (80048-METABOL) TLB-CBC Platelet - w/Differential (85025-CBCD) TLB-Hepatic/Liver Function Pnl (80076-HEPATIC) TLB-Sedimentation Rate (ESR) (85652-ESR) Dermatology Referral (Derma) Specimen Handling (40981)  Discussed avoidance of triggers and symptomatic treatment.   Complete Medication List: 1)  Omeprazole 20 Mg Cpdr (Omeprazole) .... Take 1 tablet by mouth once a day as needed. 2)  Nasonex 50 Mcg/act Susp (Mometasone furoate) 3)  Hydrocodone-acetaminophen 5-500 Mg Tabs (Hydrocodone-acetaminophen) .... 4-6 hours as needed 4)  Tramadol Hcl 50 Mg Tabs (Tramadol hcl) .Marland Kitchen.. 1 every 6 hrs as needed pain , headache 5)  Prednisone 10 Mg Tabs (Prednisone) .... 3 by mouth once daily for 3 days then 2 by mouth once daily for 3 days then 1 by mouth once daily for 3 days   Orders Added: 1)  Venipuncture [36415] 2)  TLB-BMP (Basic Metabolic Panel-BMET) [80048-METABOL] 3)  TLB-CBC Platelet - w/Differential [  85025-CBCD] 4)  TLB-Hepatic/Liver Function Pnl [80076-HEPATIC] 5)  TLB-Sedimentation Rate (ESR) [85652-ESR] 6)  Dermatology Referral [Derma] 7)  Specimen Handling [99000] 8)  Est. Patient Level III [81017]

## 2011-02-22 ENCOUNTER — Inpatient Hospital Stay (HOSPITAL_COMMUNITY)
Admission: RE | Admit: 2011-02-22 | Discharge: 2011-02-24 | DRG: 470 | Disposition: A | Discharge status: Home / Self Care | Payer: 59 | Source: Ambulatory Visit | Attending: Orthopedic Surgery | Admitting: Orthopedic Surgery

## 2011-02-22 ENCOUNTER — Inpatient Hospital Stay (HOSPITAL_COMMUNITY): Payer: 59

## 2011-02-22 DIAGNOSIS — IMO0002 Reserved for concepts with insufficient information to code with codable children: Principal | ICD-10-CM | POA: Diagnosis present

## 2011-02-22 DIAGNOSIS — M1710 Unilateral primary osteoarthritis, unspecified knee: Principal | ICD-10-CM | POA: Diagnosis present

## 2011-02-22 DIAGNOSIS — K219 Gastro-esophageal reflux disease without esophagitis: Secondary | ICD-10-CM | POA: Diagnosis present

## 2011-02-22 LAB — TYPE AND SCREEN: Antibody Screen: NEGATIVE

## 2011-02-23 LAB — BASIC METABOLIC PANEL
CO2: 31 mEq/L (ref 19–32)
Calcium: 8.2 mg/dL — ABNORMAL LOW (ref 8.4–10.5)
GFR calc Af Amer: >60 mL/min (ref >60)
GFR calc non Af Amer: >60 mL/min (ref >60)
Sodium: 139 mEq/L (ref 135–145)

## 2011-02-23 LAB — CBC
Hemoglobin: 11.5 g/dL — ABNORMAL LOW (ref 12.0–15.0)
MCHC: 31.6 g/dL (ref 30.0–36.0)
RDW: 13.9 % (ref 11.5–15.5)
WBC: 13.1 10*3/uL — ABNORMAL HIGH (ref 4.0–10.5)

## 2011-02-23 LAB — PROTIME-INR
INR: 1.02 (ref 0.00–1.49)
Prothrombin Time: 13.6 seconds (ref 11.6–15.2)

## 2011-02-24 LAB — PROTIME-INR
INR: 1.85 — ABNORMAL HIGH (ref 0.00–1.49)
Prothrombin Time: 21.5 seconds — ABNORMAL HIGH (ref 11.6–15.2)

## 2011-02-24 LAB — CBC
Hemoglobin: 10.8 g/dL — ABNORMAL LOW (ref 12.0–15.0)
RBC: 3.57 MIL/uL — ABNORMAL LOW (ref 3.87–5.11)

## 2011-02-24 LAB — BASIC METABOLIC PANEL
GFR calc non Af Amer: >60 mL/min (ref >60)
Potassium: 4.1 mEq/L (ref 3.5–5.1)
Sodium: 138 mEq/L (ref 135–145)

## 2011-02-28 LAB — CBC
HCT: 42.2 % (ref 36.0–46.0)
Hemoglobin: 13.8 g/dL (ref 12.0–15.0)
MCHC: 32.8 g/dL (ref 30.0–36.0)
MCV: 86.9 fL (ref 78.0–100.0)
Platelets: 366 10*3/uL (ref 150–400)
RBC: 4.86 MIL/uL (ref 3.87–5.11)
RDW: 21.4 % — ABNORMAL HIGH (ref 11.5–15.5)
WBC: 9.4 10*3/uL (ref 4.0–10.5)

## 2011-02-28 LAB — COMPREHENSIVE METABOLIC PANEL WITH GFR
ALT: 20 U/L (ref 0–35)
AST: 21 U/L (ref 0–37)
CO2: 30 meq/L (ref 19–32)
Calcium: 9.2 mg/dL (ref 8.4–10.5)
Creatinine, Ser: 0.8 mg/dL (ref 0.4–1.2)
GFR calc Af Amer: >60 mL/min (ref >60)
GFR calc non Af Amer: >60 mL/min (ref >60)
Sodium: 145 meq/L (ref 135–145)
Total Protein: 7.8 g/dL (ref 6.0–8.3)

## 2011-02-28 LAB — URINALYSIS, ROUTINE W REFLEX MICROSCOPIC
Bilirubin Urine: NEGATIVE
Glucose, UA: NEGATIVE mg/dL
Ketones, ur: NEGATIVE mg/dL
Leukocytes, UA: NEGATIVE
Nitrite: NEGATIVE
Protein, ur: NEGATIVE mg/dL
Specific Gravity, Urine: 1.012 (ref 1.005–1.030)
Urobilinogen, UA: 0.2 mg/dL (ref 0.0–1.0)
pH: 5 (ref 5.0–8.0)

## 2011-02-28 LAB — DIFFERENTIAL
Basophils Absolute: 0 10*3/uL (ref 0.0–0.1)
Basophils Relative: 0 % (ref 0–1)
Eosinophils Absolute: 0.4 10*3/uL (ref 0.0–0.7)
Eosinophils Relative: 4 % (ref 0–5)
Lymphocytes Relative: 29 % (ref 12–46)
Lymphs Abs: 2.7 K/uL (ref 0.7–4.0)
Monocytes Absolute: 0.9 10*3/uL (ref 0.1–1.0)
Monocytes Relative: 10 % (ref 3–12)
Neutro Abs: 5.4 10*3/uL (ref 1.7–7.7)
Neutrophils Relative %: 57 % (ref 43–77)

## 2011-02-28 LAB — COMPREHENSIVE METABOLIC PANEL
Albumin: 4.1 g/dL (ref 3.5–5.2)
Alkaline Phosphatase: 91 U/L (ref 39–117)
BUN: 9 mg/dL (ref 6–23)
Chloride: 104 mEq/L (ref 96–112)
Glucose, Bld: 92 mg/dL (ref 70–99)
Potassium: 4.1 mEq/L (ref 3.5–5.1)
Total Bilirubin: 0.3 mg/dL (ref 0.3–1.2)

## 2011-02-28 LAB — AMMONIA: Ammonia: 2 umol/L — ABNORMAL LOW (ref 11–35)

## 2011-02-28 LAB — URINE MICROSCOPIC-ADD ON

## 2011-03-02 NOTE — Op Note (Signed)
NAMELAQUILLA, DAULT NO.:  [id number]  MEDICAL RECORD NO.:  [id number]           PATIENT TYPE:  I  LOCATION:  5017                         FACILITY:  [facility]  PHYSICIAN:  Loreta Ave, M.D. DATE OF BIRTH:  27-Sep-1961  DATE OF PROCEDURE:  02/23/2011 DATE OF DISCHARGE:                              OPERATIVE REPORT   PREOPERATIVE DIAGNOSES:  Right knee end-stage degenerative arthritis, varus alignment.  The left knee moderate tricompartmental degenerative arthritis.  POSTOPERATIVE DIAGNOSES:  Right knee end-stage degenerative arthritis, varus alignment.  The left knee moderate tricompartmental degenerative arthritis.  PROCEDURES:  Right knee modified minimally invasive total knee replacement Stryker triathlon prosthesis.  Soft tissue balancing. Cemented pegged posterior stabilized #4 femoral component.  Cemented #4 tibial component 9-mm insert.  Cemented resurfacing 35-mm patellar component.  Left knee intra-articular injection Depo-Medrol and Marcaine.  SURGEON:  Loreta Ave, MD  ASSISTANT:  Genene Churn. Barry Dienes, PA-C present throughout the entire case necessary for timely completion of procedure.  ANESTHESIA:  General.  BLOOD LOSS:  Minimal.  SPECIMEN:  None.  CULTURES:  None.  COMPLICATIONS:  None.  DRESSINGS:  Sterile compressive knee immobilizer on the right.  DRAIN:  Hemovac x1 on the right.  TOURNIQUET TIME:  1 hour and 50 minutes on the right.  PROCEDURE:  The patient was brought to the operating room, placed on the operating table in supine position.  After adequate anesthesia had been obtained, attention turned to the left.  Good alignment, good stability and not much crepitus.  Under sterile technique, I injected intra- articularly with Depo-Medrol and Marcaine.  Attention was then turned to the right.  Tourniquet applied.  Prepped and draped in usual sterile fashion.  Exsanguinated with elevation Esmarch.  Tourniquet inflated  to 350 mmHg.  Straight incision above the patella down to the tibial tubercle.  Medial arthrotomy vastus splitting preserving quad tendon. Hemostasis with cautery.  Grade 4 changes most marked medial.  Medial capsule release.  Knee exposed.  Remnants of menisci, loose bodies and cruciate ligaments excised.  Distal femur exposed.  Intramedullary guide placed.  A 10-mm resection set at 5 degrees of valgus.  Using epicondylar axis size cut and fitted for a #4 posterior stabilized pegged femoral component.  Attention turned to the tibia. Extramedullary guide placed.  A 3-degree posterior slope cut.  Size #4 component.  Patella exposed posterior 10-mm removed.  Sized, drilled and fitted for 35-mm component.  Trials put in place.  A #4 above and below 9-mm insert.  A 35-mm component on the patella.  With this construct, excellent mechanical axis.  Full extension, full flexion, good alignment, good stability and good patellofemoral tracking.  Tibia was marked for rotation and head reamed.  All trials removed.  Copious irrigation with pulse irrigating device.  Cement prepared, placed on all components which were firmly seated.  Polyethylene attached to tibia and the knee reduced.  Patella held with the clamp.  Once cement hardened, I was again pleased with alignment, stability and tracking.  Wound irrigated.  Hemovac placed and brought through a separate stab wound. Arthrotomy closed with #1 Vicryl.  Skin and subcutaneous  tissue with Vicryl and staples.  Sterile compressive dressing applied.  Tourniquet was deflated and removed.  Knee immobilizer applied.  Anesthesia reversed.  Brought to the recovery room.  Tolerated surgery well.  No complications.     Loreta Ave, M.D.     DFM/MEDQ  D:  02/23/2011  T:  02/24/2011  Job:  [id number]  Electronically Signed by Mckinley Jewel M.D. on 03/02/2011 11:49:08 AM

## 2011-03-03 ENCOUNTER — Telehealth: Payer: Self-pay | Admitting: *Deleted

## 2011-03-03 MED ORDER — NYSTATIN 100000 UNIT/ML MT SUSP: 500000.0000 [IU] | OROMUCOSAL | Status: DC

## 2011-03-03 NOTE — Telephone Encounter (Signed)
Nystatin swish and spit 150 cc  5 ml swish and spit qid prn

## 2011-03-03 NOTE — Telephone Encounter (Signed)
Pt left VM that she just had surgery on her knee and in home nurse advise her to contact her PCP for thrush on tongue. Pt is requesting med to be Rx.  Pt indicated that she have total knee on right knee on.02-22-11. Pt now has thrush on her tongue. Pt uses [facility] [address] . Please advise.Felecia Francessca Friis CMA

## 2011-03-03 NOTE — Telephone Encounter (Signed)
Rx faxed to pharmacy.      KP 

## 2011-03-15 ENCOUNTER — Encounter (HOSPITAL_BASED_OUTPATIENT_CLINIC_OR_DEPARTMENT_OTHER): Payer: 59 | Admitting: Hematology & Oncology

## 2011-03-15 ENCOUNTER — Other Ambulatory Visit: Payer: Self-pay | Admitting: Hematology & Oncology

## 2011-03-15 DIAGNOSIS — D509 Iron deficiency anemia, unspecified: Secondary | ICD-10-CM

## 2011-03-15 LAB — CBC WITH DIFFERENTIAL (CANCER CENTER ONLY)
BASO#: 0.1 10*3/uL (ref 0.0–0.2)
Eosinophils Absolute: 0.1 10*3/uL (ref 0.0–0.5)
HCT: 38.7 % (ref 34.8–46.6)
HGB: 12.8 g/dL (ref 11.6–15.9)
LYMPH%: 31.1 % (ref 14.0–48.0)
MCV: 91 fL (ref 81–101)
MONO#: 0.8 10*3/uL (ref 0.1–0.9)
NEUT%: 55.4 % (ref 39.6–80.0)
RBC: 4.25 10*6/uL (ref 3.70–5.32)
WBC: 7 10*3/uL (ref 3.9–10.0)

## 2011-03-15 LAB — RETICULOCYTES (CHCC): ABS Retic: 60.2 10*3/uL (ref 19.0–186.0)

## 2011-03-15 LAB — IRON AND TIBC
%SAT: 33 % (ref 20–55)
Iron: 98 ug/dL (ref 42–145)
TIBC: 294 ug/dL (ref 250–470)

## 2011-03-15 LAB — FERRITIN: Ferritin: 126 ng/mL (ref 10–291)

## 2011-04-05 ENCOUNTER — Telehealth: Payer: Self-pay | Admitting: *Deleted

## 2011-04-05 NOTE — Telephone Encounter (Signed)
Spoke w/ pt has been having earaches x3 days and is afraid of ear infection. Had recent knee replacement x1 month hasn't been released to drive and doesn't have a way to office for appt.  She has been taking nasonex and using Allegra daily not much relief. Would like to know if there is something else she could do or can medication be called to pharmacy.   [facility] [address].

## 2011-04-05 NOTE — Telephone Encounter (Signed)
Is there anyone that can drive her--- I can't treat it without seeing it.   She is doing the right things.

## 2011-04-05 NOTE — Telephone Encounter (Signed)
Tried calling the patient left message to call back     KP

## 2011-04-07 NOTE — Telephone Encounter (Signed)
Left message to call back     KP 

## 2011-04-07 NOTE — Telephone Encounter (Signed)
Spoke with patient and she stated she will call for an appt next week      KP

## 2011-07-04 ENCOUNTER — Ambulatory Visit: Payer: 59 | Admitting: Family

## 2011-10-12 ENCOUNTER — Encounter: Payer: Self-pay | Admitting: Family Medicine

## 2011-10-12 ENCOUNTER — Ambulatory Visit (INDEPENDENT_AMBULATORY_CARE_PROVIDER_SITE_OTHER): Payer: 59 | Admitting: Family Medicine

## 2011-10-12 VITALS — BP 110/74 | HR 75 | Temp 98.5°F | Wt 225.4 lb

## 2011-10-12 DIAGNOSIS — R0789 Other chest pain: Secondary | ICD-10-CM

## 2011-10-12 DIAGNOSIS — R509 Fever, unspecified: Secondary | ICD-10-CM

## 2011-10-12 DIAGNOSIS — J329 Chronic sinusitis, unspecified: Secondary | ICD-10-CM

## 2011-10-12 DIAGNOSIS — J40 Bronchitis, not specified as acute or chronic: Secondary | ICD-10-CM

## 2011-10-12 MED ORDER — CLARITHROMYCIN ER 500 MG PO TB24: 1000.0000 mg | ORAL_TABLET | Freq: Every day | ORAL | Status: AC

## 2011-10-12 MED ORDER — GUAIFENESIN-CODEINE 100-10 MG/5ML PO SYRP: 5.0000 mL | ORAL_SOLUTION | Freq: Three times a day (TID) | ORAL | Status: AC | PRN

## 2011-10-12 NOTE — Progress Notes (Signed)
  Subjective:     Amber Hebert is a 50 y.o. female who presents for evaluation of symptoms of a URI. Symptoms include bilateral ear pressure/pain, congestion, cough described as productive, facial pain, low grade fever, nasal congestion, productive cough with  green colored sputum, sinus pressure and wheezing. Onset of symptoms was 7 days ago, and has been gradually worsening since that time. Treatment to date: none.  The following portions of the patient's history were reviewed and updated as appropriate: allergies, current medications, past family history, past medical history, past social history, past surgical history and problem list.  Review of Systems Pertinent items are noted in HPI.   Objective:    BP 110/74  Pulse 75  Temp(Src) 98.5 F (36.9 C) (Oral)  Wt 225 lb 6.4 oz (102.241 kg)  SpO2 93% General appearance: alert, cooperative, appears stated age and no distress Ears: normal TM's and external ear canals both ears Nose: green discharge, moderate congestion, no sinus tenderness Neck: mild anterior cervical adenopathy, supple, symmetrical, trachea midline and thyroid not enlarged, symmetric, no tenderness/mass/nodules Lungs: clear to auscultation bilaterally Heart: regular rate and rhythm, S1, S2 normal, no murmur, click, rub or gallop Extremities: extremities normal, atraumatic, no cyanosis or edema   Assessment:    sinusitis  bronchitis Plan:    Discussed the diagnosis and treatment of sinusitis. Suggested symptomatic OTC remedies. Nasal saline spray for congestion. biaxin per orders. Follow up as needed. Call in several days if symptoms aren't resolving.

## 2011-10-12 NOTE — Patient Instructions (Signed)

## 2011-10-18 ENCOUNTER — Telehealth: Payer: Self-pay | Admitting: Family Medicine

## 2011-10-18 MED ORDER — OMEPRAZOLE 20 MG PO CPDR: 20.0000 mg | DELAYED_RELEASE_CAPSULE | Freq: Every day | ORAL | Status: DC

## 2011-10-18 NOTE — Telephone Encounter (Signed)
Faxed.   KP 

## 2011-10-18 NOTE — Telephone Encounter (Signed)
Patient needs refill - omeprazole - [facility] [address]

## 2011-10-23 ENCOUNTER — Encounter: Payer: Self-pay | Admitting: *Deleted

## 2011-11-03 ENCOUNTER — Ambulatory Visit: Payer: 59 | Admitting: Hematology & Oncology

## 2011-11-03 ENCOUNTER — Other Ambulatory Visit: Payer: 59 | Admitting: Lab

## 2011-11-03 ENCOUNTER — Telehealth: Payer: Self-pay | Admitting: *Deleted

## 2011-11-03 NOTE — Telephone Encounter (Signed)
Pt moved 11-23 to 12-24

## 2011-12-04 ENCOUNTER — Ambulatory Visit (HOSPITAL_BASED_OUTPATIENT_CLINIC_OR_DEPARTMENT_OTHER): Payer: 59 | Admitting: Hematology & Oncology

## 2011-12-04 ENCOUNTER — Ambulatory Visit (HOSPITAL_BASED_OUTPATIENT_CLINIC_OR_DEPARTMENT_OTHER): Payer: 59 | Admitting: Lab

## 2011-12-04 DIAGNOSIS — D509 Iron deficiency anemia, unspecified: Secondary | ICD-10-CM

## 2011-12-04 LAB — CBC WITH DIFFERENTIAL (CANCER CENTER ONLY)
Eosinophils Absolute: 0.3 10*3/uL (ref 0.0–0.5)
HCT: 39.6 % (ref 34.8–46.6)
HGB: 12.9 g/dL (ref 11.6–15.9)
LYMPH%: 27.8 % (ref 14.0–48.0)
MCV: 93 fL (ref 81–101)
MONO#: 0.8 10*3/uL (ref 0.1–0.9)
NEUT%: 57 % (ref 39.6–80.0)
RBC: 4.25 10*6/uL (ref 3.70–5.32)
WBC: 7.6 10*3/uL (ref 3.9–10.0)

## 2011-12-04 LAB — CHCC SATELLITE - SMEAR

## 2011-12-04 LAB — RETICULOCYTES (CHCC): RBC.: 4.31 MIL/uL (ref 3.87–5.11)

## 2011-12-04 NOTE — Progress Notes (Signed)
CC:   Amber Perla, DO  DIAGNOSIS:  Recurrent iron deficiency anemia.  CURRENT THERAPY:  Observation.  INTERIM HISTORY:  Amber Hebert comes in for followup.  I last saw her in April. She is doing fairly well.  She has had no real complaints. She got to her knee surgery on her right knee. She has a lot more mobility with that knee.  When we last saw her in April, her ferritin was 126.  The last she got iron was back in March 2011.  She has had no "cravings."  There has been no bleeding. She has had no nausea and vomiting. She has had no change in bowel or bladder habits. There has been no change in medications.  PHYSICAL EXAMINATION:  General: This is a well-developed, well- nourished, white female, in no obvious distress. Vital signs: Show a temperature of 98.1, pulse 73, respiratory rate 18, blood pressure 111/77, and weight is 227. Head and neck exam shows a normocephalic, atraumatic skull.  There are no ocular or oral lesions.  Conjunctiva are pink.  Lungs are clear bilaterally. Cardiac exam: Regular rate and rhythm with a normal S1 and S2. There are no murmurs, rubs, or bruits. Abdominal exam: Soft with good bowel sounds.  There is no palpable abdominal mass.  There is no fluid wave.  There is no palpable hepatosplenomegaly. Extremities: Show no clubbing, cyanosis, or edema. Neurological exam shows no focal neurological deficits.  LABORATORY STUDIES:  White cell count is 7.8, hemoglobin 12.9, hematocrit 39.6, and platelet count 302. MCV is 93.  IMPRESSION:  Amber Hebert is a very kind 50 year old white female. She actually has 6 grand-kids.  She looks very young for having grand- kids.  She really is a great mom and a wonderful grandmother mother.  We will see what her iron studies are. One would have to think that they would be okay.  We will go ahead and plan to get her back in another 4 months for followup.    ______________________________ Josph Macho, M.D. PRE/MEDQ  D:  12/04/2011  T:  12/04/2011  Job:  [id number]  ADDENDUM:  Ferritin is 37.

## 2011-12-04 NOTE — Progress Notes (Signed)
This office note has been dictated.

## 2012-03-29 ENCOUNTER — Ambulatory Visit (HOSPITAL_BASED_OUTPATIENT_CLINIC_OR_DEPARTMENT_OTHER): Payer: BC Managed Care – PPO | Admitting: Hematology & Oncology

## 2012-03-29 ENCOUNTER — Other Ambulatory Visit (HOSPITAL_BASED_OUTPATIENT_CLINIC_OR_DEPARTMENT_OTHER): Payer: BC Managed Care – PPO | Admitting: Lab

## 2012-03-29 VITALS — BP 105/73 | HR 64 | Temp 97.0°F | Ht 70.0 in | Wt 229.0 lb

## 2012-03-29 DIAGNOSIS — E559 Vitamin D deficiency, unspecified: Secondary | ICD-10-CM

## 2012-03-29 DIAGNOSIS — D509 Iron deficiency anemia, unspecified: Secondary | ICD-10-CM

## 2012-03-29 DIAGNOSIS — Z1239 Encounter for other screening for malignant neoplasm of breast: Secondary | ICD-10-CM

## 2012-03-29 DIAGNOSIS — R635 Abnormal weight gain: Secondary | ICD-10-CM

## 2012-03-29 LAB — CBC WITH DIFFERENTIAL (CANCER CENTER ONLY)
Eosinophils Absolute: 0.4 10*3/uL (ref 0.0–0.5)
LYMPH%: 38.6 % (ref 14.0–48.0)
MCV: 93 fL (ref 81–101)
MONO#: 0.8 10*3/uL (ref 0.1–0.9)
NEUT#: 3.2 10*3/uL (ref 1.5–6.5)
Platelets: 255 10*3/uL (ref 145–400)
RBC: 4.21 10*6/uL (ref 3.70–5.32)
WBC: 7.2 10*3/uL (ref 3.9–10.0)

## 2012-03-29 LAB — CHCC SATELLITE - SMEAR

## 2012-03-29 NOTE — Progress Notes (Signed)
CC:   Lelon Perla, DO  DIAGNOSES: 1. Recurrent iron deficiency anemia. 2. Unexpected weight gain.  CURRENT THERAPY:  IV iron as indicated.  INTERIM HISTORY:  Ms. Cortinas comes in for followup.  I last saw her back in December.  At that point in time, her ferritin was 34.  She has not had iron now for about 2 years.  She is now working at [place], [employer].  She goes out on Monday and comes back on Thursday.  She started this in January.  The patient has gained weight.  She is not sure why she has gained the weight.  She feels tired.  She has some fluid retention.  She has not seen Dr. Laury Axon for quite a while.  We will go ahead and check her for hypothyroidism.  I will do a TSH on her.  She has had no bleeding.  She does not have any menstrual cycles.  There has been no change in bowel or bladder habits.  The patient has not had a mammogram for about 2 years or so.  We will go ahead and get this set up for her also.  PHYSICAL EXAMINATION:  General:  This is an obese white female in no obvious distress.  Vital signs:  Show temperature of 97, pulse 64, respiratory rate 18, blood pressure 105/73.  Weight is 229.  Head and neck:  Exam shows a normocephalic, atraumatic skull.  There are no ocular or oral lesions.  There are no palpable cervical or supraclavicular lymph nodes.  Lungs:  Clear bilaterally.  Cardiac: Regular rate and rhythm with a normal S1 and S2.  There are no murmurs, rubs or bruits.  Abdomen:  Soft, obese.  She has good bowel sounds. There is no fluid wave.  There is no palpable hepatosplenomegaly. Extremities:  Shows 1+ edema in her legs bilaterally.  Skin:  Exam shows some slightly dry skin.  LABORATORY STUDIES:  White cell count is 7.2, hemoglobin 13, hematocrit 39.2, platelet count 255.  MCV is 93.  IMPRESSION:  Ms. Chandonnet is a 51 year old white female with multiple issues.  We have been taking care of her for iron deficiency.  I  suspect that she might be deficient now.  We will see what her ferritin level is.  If she is low, we will give her IV iron.  Even though she is not anemic, I suspect that some of her symptoms might reflect a low iron.  I am also going to check her vitamin D and TSH.  She will have a mammogram done.  We will plan to have her come back to see [place] in another 3-4 months.  By then, hopefully, she will be feeling better.    ______________________________ Josph Macho, M.D. PRE/MEDQ  D:  03/29/2012  T:  03/29/2012  Job:  [id number]

## 2012-03-29 NOTE — Progress Notes (Signed)
This office note has been dictated.

## 2012-03-30 ENCOUNTER — Other Ambulatory Visit: Payer: Self-pay | Admitting: Family Medicine

## 2012-03-30 LAB — FERRITIN: Ferritin: 30 ng/mL (ref 10–291)

## 2012-03-30 LAB — VITAMIN D 25 HYDROXY (VIT D DEFICIENCY, FRACTURES): Vit D, 25-Hydroxy: 39 ng/mL (ref 30–89)

## 2012-03-30 LAB — IRON AND TIBC
TIBC: 296 ug/dL (ref 250–470)
UIBC: 209 ug/dL (ref 125–400)

## 2012-03-30 LAB — RETICULOCYTES (CHCC): RBC.: 4.22 MIL/uL (ref 3.87–5.11)

## 2012-04-03 ENCOUNTER — Other Ambulatory Visit: Payer: Self-pay | Admitting: *Deleted

## 2012-04-03 ENCOUNTER — Telehealth: Payer: Self-pay | Admitting: *Deleted

## 2012-04-03 NOTE — Telephone Encounter (Signed)
Message copied by Anselm Jungling on Wed Apr 03, 2012  1:57 PM ------      Message from: Josph Macho      Created: Tue Apr 02, 2012  3:13 PM       Call -iron is a little low. Need to set up Feraheme  1020mg  x 1 dose.  She works out of town so we need to work around her schedule.  pete

## 2012-04-03 NOTE — Telephone Encounter (Signed)
Called patient to tell her that her iron levels were low and needs to get Feraheme 1020 mg IV .  Patient is actually in town and would like to come Thursday 04/04/12 at 1:00p.

## 2012-04-04 ENCOUNTER — Ambulatory Visit (HOSPITAL_BASED_OUTPATIENT_CLINIC_OR_DEPARTMENT_OTHER): Payer: BC Managed Care – PPO

## 2012-04-04 DIAGNOSIS — D509 Iron deficiency anemia, unspecified: Secondary | ICD-10-CM

## 2012-04-04 MED ORDER — SODIUM CHLORIDE 0.9 % IV SOLN
Freq: Once | INTRAVENOUS | Status: AC
  Administered 2012-04-04: 14:00:00 via INTRAVENOUS

## 2012-04-04 MED ORDER — SODIUM CHLORIDE 0.9 % IV SOLN
1020.0000 mg | Freq: Once | INTRAVENOUS | Status: AC
  Administered 2012-04-04: 1020 mg via INTRAVENOUS
  Filled 2012-04-04: qty 34

## 2012-04-04 NOTE — Patient Instructions (Signed)
Ferumoxytol injection What is this medicine? FERUMOXYTOL is an iron complex. Iron is used to make healthy red blood cells, which carry oxygen and nutrients throughout the body. This medicine is used to treat iron deficiency anemia in people with chronic kidney disease. This medicine may be used for other purposes; ask your health care provider or pharmacist if you have questions. What should I tell my health care provider before I take this medicine? They need to know if you have any of these conditions: -anemia not caused by low iron levels -high levels of iron in the blood -magnetic resonance imaging (MRI) test scheduled -an unusual or allergic reaction to iron, other medicines, foods, dyes, or preservatives -pregnant or trying to get pregnant -breast-feeding How should I use this medicine? This medicine is for infusion into a vein. It is given by a health care professional in a hospital or clinic setting. Talk to your pediatrician regarding the use of this medicine in children. Special care may be needed. Overdosage: If you think you've taken too much of this medicine contact a poison control center or emergency room at once. Overdosage: If you think you have taken too much of this medicine contact a poison control center or emergency room at once. NOTE: This medicine is only for you. Do not share this medicine with others. What if I miss a dose? It is important not to miss your dose. Call your doctor or health care professional if you are unable to keep an appointment. What may interact with this medicine? This medicine may interact with the following medications: -other iron products This list may not describe all possible interactions. Give your health care provider a list of all the medicines, herbs, non-prescription drugs, or dietary supplements you use. Also tell them if you smoke, drink alcohol, or use illegal drugs. Some items may interact with your medicine. What should I watch  for while using this medicine? Visit your doctor or healthcare professional regularly. Tell your doctor or healthcare professional if your symptoms do not start to get better or if they get worse. You may need blood work done while you are taking this medicine. You may need to follow a special diet. Talk to your doctor. Foods that contain iron include: whole grains/cereals, dried fruits, beans, or peas, leafy green vegetables, and organ meats (liver, kidney). What side effects may I notice from receiving this medicine? Side effects that you should report to your doctor or health care professional as soon as possible: -allergic reactions like skin rash, itching or hives, swelling of the face, lips, or tongue -breathing problems -changes in blood pressure -feeling faint or lightheaded, falls -fever or chills -flushing, sweating, or hot feelings -swelling of the ankles or feet Side effects that usually do not require medical attention (Report these to your doctor or health care professional if they continue or are bothersome.): -diarrhea -headache -nausea, vomiting -stomach pain This list may not describe all possible side effects. Call your doctor for medical advice about side effects. You may report side effects to FDA at 1-800-FDA-1088. Where should I keep my medicine? This drug is given in a hospital or clinic and will not be stored at home. NOTE: This sheet is a summary. It may not cover all possible information. If you have questions about this medicine, talk to your doctor, pharmacist, or health care provider.  2012, [employer]vier/[employer] Standard. (08/19/2008 9:48:25 PM) 

## 2012-04-12 ENCOUNTER — Ambulatory Visit: Payer: BC Managed Care – PPO

## 2012-04-15 ENCOUNTER — Ambulatory Visit: Payer: 59 | Admitting: Family Medicine

## 2012-04-22 ENCOUNTER — Ambulatory Visit: Payer: BC Managed Care – PPO

## 2012-06-27 ENCOUNTER — Other Ambulatory Visit: Payer: Self-pay | Admitting: Family Medicine

## 2012-06-28 ENCOUNTER — Ambulatory Visit: Payer: BC Managed Care – PPO | Admitting: Hematology & Oncology

## 2012-06-28 ENCOUNTER — Other Ambulatory Visit: Payer: BC Managed Care – PPO | Admitting: Lab

## 2012-07-01 ENCOUNTER — Telehealth: Payer: Self-pay | Admitting: Hematology & Oncology

## 2012-07-01 NOTE — Telephone Encounter (Signed)
Missed 7-19 appointment left message for pt to call if she would like to reschedule

## 2012-08-30 ENCOUNTER — Ambulatory Visit (INDEPENDENT_AMBULATORY_CARE_PROVIDER_SITE_OTHER): Payer: BC Managed Care – PPO | Admitting: Family

## 2012-08-30 ENCOUNTER — Encounter: Payer: Self-pay | Admitting: Family

## 2012-08-30 VITALS — BP 110/74 | HR 83 | Temp 98.0°F | Resp 16 | Wt 231.0 lb

## 2012-08-30 DIAGNOSIS — H6690 Otitis media, unspecified, unspecified ear: Secondary | ICD-10-CM

## 2012-08-30 DIAGNOSIS — H6693 Otitis media, unspecified, bilateral: Secondary | ICD-10-CM | POA: Insufficient documentation

## 2012-08-30 MED ORDER — AZITHROMYCIN 250 MG PO TABS: ORAL_TABLET | ORAL | Status: DC

## 2012-08-30 MED ORDER — OMEPRAZOLE 40 MG PO CPDR: 40.0000 mg | DELAYED_RELEASE_CAPSULE | Freq: Every day | ORAL | Status: DC

## 2012-08-30 NOTE — Assessment & Plan Note (Signed)
Pen Allergic- will rx with zpak.

## 2012-08-30 NOTE — Progress Notes (Signed)
  Subjective:    Patient ID: Amber Hebert, female    DOB: 30-Jul-1961, 51 y.o.   MRN: [id number]  HPI  [name] is a 51 yr old female who presents today with complaint of bilateral ear pain, low grade temp (subjective) with chills/sweats, headache and fatigue. Symptoms started a few nights ago.  Reports that she felt faint and then developed left ear pain.  Today the right ear has started to throbbing.   She tried ibuprofen and nyquil last night. She reports associated fatigue which is unusual. Symptoms are worsening. Review of Systems    see HPI  Past Medical History  Diagnosis Date  . Anemia   . GERD (gastroesophageal reflux disease)   . Incontinence of urine     History   Social History  . Marital Status: Married    Spouse Name: N/A    Number of Children: N/A  . Years of Education: N/A   Occupational History  . Not on file.   Social History Main Topics  . Smoking status: Never Smoker   . Smokeless tobacco: Never Used  . Alcohol Use: No  . Drug Use: No  . Sexually Active: Not on file   Other Topics Concern  . Not on file   Social History Narrative  . No narrative on file    Past Surgical History  Procedure Date  . External ear surgery     Ear Bone Surgery  . Upper gastrointestinal endoscopy     Family History  Problem Relation Age of Onset  . Alcohol abuse    . Breast cancer    . Stroke    . Sudden death    . Heart disease      Allergies  Allergen Reactions  . Fluzone     REACTION: anaphylaxsis, hives  . Oseltamivir Phosphate   . Penicillins   . Sulfonamide Derivatives     Current Outpatient Prescriptions on File Prior to Visit  Medication Sig Dispense Refill  . Multiple Vitamin (MULTIVITAMIN) capsule Take 1 capsule by mouth daily.      . Pseudoeph-Doxylamine-DM-APAP (NYQUIL PO) Take 1 application by mouth daily as needed.        . pseudoephedrine (SUDAFED) 30 MG tablet Take 30 mg by mouth every 4 (four) hours as needed.        .  Pseudoephedrine-APAP-DM (DAYQUIL MULTI-SYMPTOM COLD/FLU PO) Take 1 application by mouth daily.        Marland Kitchen DISCONTD: omeprazole (PRILOSEC) 20 MG capsule TAKE ONE CAPSULE BY MOUTH EVERY DAY  30 capsule  1    BP 110/74  Pulse 83  Temp 98 F (36.7 C) (Oral)  Resp 16  Wt 231 lb (104.781 kg)  SpO2 99%    Objective:   Physical Exam  Constitutional: She appears well-developed and well-nourished. No distress.  HENT:  Head: Normocephalic and atraumatic.       R TM with serous effusion L TM retracted erythema at 1 oclock  Eyes: No scleral icterus.  Cardiovascular: Normal rate and regular rhythm.   No murmur heard. Pulmonary/Chest: Effort normal and breath sounds normal. No respiratory distress. She has no wheezes. She has no rales. She exhibits no tenderness.  Lymphadenopathy:    She has no cervical adenopathy.  Psychiatric: She has a normal mood and affect. Her behavior is normal. Judgment and thought content normal.          Assessment & Plan:

## 2012-08-30 NOTE — Patient Instructions (Addendum)
Otitis Media with Effusion  Otitis media with effusion is the presence of fluid in the middle ear. This is a common problem that often follows ear infections. It may be present for weeks or longer after the infection. Unlike an acute ear infection, otits media with effusion refers only to fluid behind the ear drum and not infection. Children with repeated ear and sinus infections and allergy problems are the most likely to get otitis media with effusion.  CAUSES   The most frequent cause of the fluid buildup is dysfunction of the eustacian tubes. These are the tubes that drain fluid in the ears to the throat.  SYMPTOMS    The main symptom of this condition is hearing loss. As a result, you or your child may:   Listen to the TV at a loud volume.   Not respond to questions.   Ask "what" often when spoken to.   There may be a sensation of fullness or pressure but usually not pain.  DIAGNOSIS    Your caregiver will diagnose this condition by examining you or your child's ears.   Your caregiver may test the pressure in you or your child's ear with a tympanometer.   A hearing test may be conducted if the problem persists.   A caregiver will want to re-evaluate the condition periodically to see if it improves.  TREATMENT    Treatment depends on the duration and the effects of the effusion.   Antibiotics, decongestants, nose drops, and cortisone-type drugs may not be helpful.   Children with persistent ear effusions may have delayed language. Children at risk for developmental delays in hearing, learning, and speech may require referral to a specialist earlier than children not at risk.   You or your child's caregiver may suggest a referral to an Ear, Nose, and Throat (ENT) surgeon for treatment. The following may help restore normal hearing:   Drainage of fluid.   Placement of ear tubes (tympanostomy tubes).   Removal of adenoids (adenoidectomy).  HOME CARE INSTRUCTIONS    Avoid second hand  smoke.   Infants who are breast fed are less likely to have this condition.   Avoid feeding infants while laying flat.   Avoid known environmental allergens.   Be sure to see a caregiver or an ENT specialist for follow up.   Avoid people who are sick.  SEEK MEDICAL CARE IF:    Hearing is not better in 3 months.   Hearing is worse.   Ear pain.   Drainage from the ear.   Dizziness.  Document Released: 01/04/2005 Document Revised: 11/16/2011 Document Reviewed: 04/19/2010  Exit[employer] Patient Information 2012 [employer].

## 2012-10-18 ENCOUNTER — Ambulatory Visit: Payer: BC Managed Care – PPO | Admitting: Medical

## 2012-10-18 ENCOUNTER — Other Ambulatory Visit: Payer: BC Managed Care – PPO | Admitting: Lab

## 2012-10-21 ENCOUNTER — Telehealth: Payer: Self-pay | Admitting: Hematology & Oncology

## 2012-10-21 NOTE — Telephone Encounter (Signed)
Pt made 12-2 appointment

## 2012-10-21 NOTE — Telephone Encounter (Signed)
Left pt message to call if she would like to reschedule missed 11-8 appointment

## 2012-11-11 ENCOUNTER — Ambulatory Visit (HOSPITAL_BASED_OUTPATIENT_CLINIC_OR_DEPARTMENT_OTHER): Payer: BC Managed Care – PPO | Admitting: Hematology & Oncology

## 2012-11-11 ENCOUNTER — Other Ambulatory Visit (HOSPITAL_BASED_OUTPATIENT_CLINIC_OR_DEPARTMENT_OTHER): Payer: BC Managed Care – PPO | Admitting: Lab

## 2012-11-11 VITALS — BP 116/65 | HR 72 | Temp 98.3°F | Resp 18 | Ht 70.0 in | Wt 237.0 lb

## 2012-11-11 DIAGNOSIS — E559 Vitamin D deficiency, unspecified: Secondary | ICD-10-CM

## 2012-11-11 DIAGNOSIS — R5381 Other malaise: Secondary | ICD-10-CM

## 2012-11-11 DIAGNOSIS — R635 Abnormal weight gain: Secondary | ICD-10-CM

## 2012-11-11 DIAGNOSIS — D509 Iron deficiency anemia, unspecified: Secondary | ICD-10-CM

## 2012-11-11 DIAGNOSIS — Z1239 Encounter for other screening for malignant neoplasm of breast: Secondary | ICD-10-CM

## 2012-11-11 LAB — CBC WITH DIFFERENTIAL (CANCER CENTER ONLY)
BASO#: 0.1 10*3/uL (ref 0.0–0.2)
Eosinophils Absolute: 0.4 10*3/uL (ref 0.0–0.5)
HGB: 14.1 g/dL (ref 11.6–15.9)
LYMPH%: 35 % (ref 14.0–48.0)
MCH: 31.7 pg (ref 26.0–34.0)
MCV: 96 fL (ref 81–101)
MONO#: 0.7 10*3/uL (ref 0.1–0.9)
MONO%: 10.4 % (ref 0.0–13.0)
NEUT#: 3.3 10*3/uL (ref 1.5–6.5)
RBC: 4.45 10*6/uL (ref 3.70–5.32)
WBC: 6.9 10*3/uL (ref 3.9–10.0)

## 2012-11-11 LAB — IRON AND TIBC
%SAT: 40 % (ref 20–55)
Iron: 108 ug/dL (ref 42–145)
TIBC: 268 ug/dL (ref 250–470)

## 2012-11-11 LAB — RETICULOCYTES (CHCC)
ABS Retic: 73.4 10*3/uL (ref 19.0–186.0)
Retic Ct Pct: 1.6 % (ref 0.4–2.3)

## 2012-11-11 NOTE — Progress Notes (Signed)
This office note has been dictated.

## 2012-11-13 NOTE — Progress Notes (Signed)
DIAGNOSIS:  Recurrent iron deficiency anemia.  CURRENT THERAPY:  IV iron as indicated.  INTERIM HISTORY:  Amber Hebert comes in for followup.  She is still working __________.  She is doing well with this. Her last iron was given back in April.  She got 1020 mg of Feraheme.  At that point in time, her ferritin was 230.  Iron saturation was 29%. She is still tired.  She feels that this is partly because of work. She has had no problems of bleeding.  There is no menstrual cycle that she has.  She has had some continued weight gain.  She has seen Dr. __________ who has tried to help with this. She has had no cough.  There have been no rashes.  There has been no nausea or vomiting.  PHYSICAL EXAMINATION:  General:  This is an obese white female in no obvious distress.  Vital signs:  Temperature of 98.3, pulse 72, respiratory rate 18, blood pressure 116/65.  Weight is 237.  Head and neck:  Normocephalic, atraumatic skull.  There are no ocular or oral lesions.  There are no palpable cervical or supraclavicular lymph nodes. Lungs:  Clear bilaterally.  Cardiac:  Regular rate and rhythm with a normal S1 and S2.  There are no murmurs, rubs, or bruits.  Abdomen: Soft with good bowel sounds.  There is no palpable abdominal mass. There is no palpable hepatosplenomegaly.  Extremities:  Shows no clubbing, cyanosis or edema.  Neurological:  Shows no focal neurological deficits.  LABORATORY STUDIES:  White cell count is 6.9, hemoglobin 14.1, hematocrit 42.6, platelet count 269.  MCV is 96.  Ferritin is 199 with iron saturation of 40%.  IMPRESSION:  Amber Hebert is a 51 year old white female with history of iron-deficiency anemia.  She is certainly not iron deficient  right now. Her blood count is doing quite nicely. I think we can probably get her back in 4-5 months.  I do not see that we need to do anything different for her.  I do not see any need for any blood work in between  visits.    ______________________________ Josph Macho, M.D. PRE/MEDQ  D:  11/12/2012  T:  11/13/2012  Job:  [id number]

## 2012-11-14 ENCOUNTER — Telehealth: Payer: Self-pay | Admitting: *Deleted

## 2012-11-14 NOTE — Telephone Encounter (Signed)
Called patient to let her know that her iron levels are doing great per dr. Lupita Leash.

## 2012-11-14 NOTE — Telephone Encounter (Signed)
Message copied by Anselm Jungling on Thu Nov 14, 2012  9:32 AM ------      Message from: Josph Macho      Created: Tue Nov 12, 2012  7:30 AM       Please call and tell her that her iron is doing great. Have a great Christmas and New Year's. Thanks. Cindee Lame

## 2012-11-25 ENCOUNTER — Encounter: Payer: Self-pay | Admitting: Family Medicine

## 2012-11-25 ENCOUNTER — Encounter: Payer: Self-pay | Admitting: Internal Medicine

## 2012-11-25 ENCOUNTER — Ambulatory Visit (INDEPENDENT_AMBULATORY_CARE_PROVIDER_SITE_OTHER): Payer: BC Managed Care – PPO | Admitting: Family Medicine

## 2012-11-25 VITALS — BP 112/68 | HR 75 | Temp 98.3°F | Wt 238.0 lb

## 2012-11-25 DIAGNOSIS — R5381 Other malaise: Secondary | ICD-10-CM

## 2012-11-25 DIAGNOSIS — K219 Gastro-esophageal reflux disease without esophagitis: Secondary | ICD-10-CM

## 2012-11-25 DIAGNOSIS — R5383 Other fatigue: Secondary | ICD-10-CM

## 2012-11-25 NOTE — Patient Instructions (Addendum)
Diet for Gastroesophageal Reflux Disease, Adult Reflux (acid reflux) is when acid from your stomach flows up into the esophagus. When acid comes in contact with the esophagus, the acid causes irritation and soreness (inflammation) in the esophagus. When reflux happens often or so severely that it causes damage to the esophagus, it is called gastroesophageal reflux disease (GERD). Nutrition therapy can help ease the discomfort of GERD. FOODS OR DRINKS TO AVOID OR LIMIT  Smoking or chewing tobacco. Nicotine is one of the most potent stimulants to acid production in the gastrointestinal tract.  Caffeinated and decaffeinated coffee and black tea.  Regular or low-calorie carbonated beverages or energy drinks (caffeine-free carbonated beverages are allowed).   Strong spices, such as black pepper, white pepper, red pepper, cayenne, curry powder, and chili powder.  Peppermint or spearmint.  Chocolate.  High-fat foods, including meats and fried foods. Extra added fats including oils, butter, salad dressings, and nuts. Limit these to less than 8 tsp per day.  Fruits and vegetables if they are not tolerated, such as citrus fruits or tomatoes.  Alcohol.  Any food that seems to aggravate your condition. If you have questions regarding your diet, call your caregiver or a registered dietitian. OTHER THINGS THAT MAY HELP GERD INCLUDE:   Eating your meals slowly, in a relaxed setting.  Eating 5 to 6 small meals per day instead of 3 large meals.  Eliminating food for a period of time if it causes distress.  Not lying down until 3 hours after eating a meal.  Keeping the head of your bed raised 6 to 9 inches (15 to 23 cm) by using a foam wedge or blocks under the legs of the bed. Lying flat may make symptoms worse.  Being physically active. Weight loss may be helpful in reducing reflux in overweight or obese adults.  Wear loose fitting clothing EXAMPLE MEAL PLAN This meal plan is approximately  2,000 calories based on https://www.bernard.org/ meal planning guidelines. Breakfast   cup cooked oatmeal.  1 cup strawberries.  1 cup low-fat milk.  1 oz almonds. Snack  1 cup cucumber slices.  6 oz yogurt (made from low-fat or fat-free milk). Lunch  2 slice whole-wheat bread.  2 oz sliced [place].  2 tsp mayonnaise.  1 cup blueberries.  1 cup snap peas. Snack  6 whole-wheat crackers.  1 oz string cheese. Dinner   cup brown rice.  1 cup mixed veggies.  1 tsp olive oil.  3 oz grilled fish. Document Released: 11/27/2005 Document Revised: 02/19/2012 Document Reviewed: 10/13/2011 [facility] Patient Information 2013 [place], [place].   Calorie Counting Diet A calorie counting diet requires you to eat the number of calories that are right for you in a day. Calories are the measurement of how much energy you get from the food you eat. Eating the right amount of calories is important for staying at a healthy weight. If you eat too many calories, your body will store them as fat and you may gain weight. If you eat too few calories, you may lose weight. Counting the number of calories you eat during a day will help you know if you are eating the right amount. A Registered Dietitian can determine how many calories you need in a day. The amount of calories needed varies from person to person. If your goal is to lose weight, you will need to eat fewer calories. Losing weight can benefit you if you are overweight or have health problems such as heart disease, high blood  pressure, or diabetes. If your goal is to gain weight, you will need to eat more calories. Gaining weight may be necessary if you have a certain health problem that causes your body to need more energy. TIPS Whether you are increasing or decreasing the number of calories you eat during a day, it may be hard to get used to changes in what you eat and drink. The following are tips to help you keep track of the number of  calories you eat.  Measure foods at home with measuring cups. This helps you know the amount of food and number of calories you are eating.  Restaurants often serve food in amounts that are larger than 1 serving. While eating out, estimate how many servings of a food you are given. For example, a serving of cooked rice is  cup or about the size of half of a fist. Knowing serving sizes will help you be aware of how much food you are eating at restaurants.  Ask for smaller portion sizes or child-size portions at restaurants.  Plan to eat half of a meal at a restaurant. Take the rest home or share the other half with a friend.  Read the Nutrition Facts panel on food labels for calorie content and serving size. You can find out how many servings are in a package, the size of a serving, and the number of calories each serving has.  For example, a package might contain 3 cookies. The Nutrition Facts panel on that package says that 1 serving is 1 cookie. Below that, it will say there are 3 servings in the container. The calories section of the Nutrition Facts label says there are 90 calories. This means there are 90 calories in 1 cookie (1 serving). If you eat 1 cookie you have eaten 90 calories. If you eat all 3 cookies, you have eaten 270 calories (3 servings x 90 calories = 270 calories). The list below tells you how big or small some common portion sizes are.  1 oz.........4 stacked dice.  3 oz........Marland KitchenDeck of cards.  1 tsp.......Marland KitchenTip of little finger.  1 tbs......Marland KitchenMarland KitchenThumb.  2 tbs.......Marland KitchenGolf ball.   cup......Marland KitchenHalf of a fist.  1 cup.......Marland KitchenA fist. KEEP A FOOD LOG Write down every food item you eat, the amount you eat, and the number of calories in each food you eat during the day. At the end of the day, you can add up the total number of calories you have eaten. It may help to keep a list like the one below. Find out the calorie information by reading the Nutrition Facts panel on food  labels. Breakfast  Bran cereal (1 cup, 110 calories).  Fat-free milk ( cup, 45 calories). Snack  Apple (1 medium, 80 calories). Lunch  Spinach (1 cup, 20 calories).  Tomato ( medium, 20 calories).  Chicken breast strips (3 oz, 165 calories).  Shredded cheddar cheese ( cup, 110 calories).  Light [place] dressing (2 tbs, 60 calories).  Whole-wheat bread (1 slice, 80 calories).  Tub margarine (1 tsp, 35 calories).  Vegetable soup (1 cup, 160 calories). Dinner  Pork chop (3 oz, 190 calories).  Brown rice (1 cup, 215 calories).  Steamed broccoli ( cup, 20 calories).  Strawberries (1  cup, 65 calories).  Whipped cream (1 tbs, 50 calories). Daily Calorie Total: 1425 Document Released: 11/27/2005 Document Revised: 02/19/2012 Document Reviewed: 05/24/2007 [facility] Patient Information 2013 [place], [place].

## 2012-11-25 NOTE — Progress Notes (Signed)
  Subjective:     Amber Hebert is an 51 y.o. female who presents for evaluation of heartburn. This has been associated with belching, belching and eructation, dysphagia, fullness after meals, heartburn, midespigastric pain and upper abdominal discomfort. She denies bilious reflux, chest pain, choking on food, deep pressure at base of neck, difficulty swallowing, melena, need to clear throat frequently, nocturnal burning and regurgitation of undigested food. Symptoms have been present for 1 year. She denies dysphagia. She has not lost weight. She has had gross gastrointestinal bleeding, with bright red blood per rectum . Medical therapy in the past has included: none.  The following portions of the patient's history were reviewed and updated as appropriate: allergies, current medications, past family history, past medical history, past social history, past surgical history and problem list.  Review of Systems Pertinent items are noted in HPI.   Objective:     BP 112/68  Pulse 75  Temp 98.3 F (36.8 C) (Oral)  Wt 238 lb (107.956 kg)  SpO2 97% General appearance: alert, cooperative, appears stated age and no distress Abdomen: abnormal findings:  mild tenderness in the epigastrium rectum--hem + stool   Assessment:    Gastroesophageal Reflux Disease,  With heme + stool    Plan:    Will start a trial of proton pump inhibitors. "Red Flag" symptoms of heme + stool are present. Will refer to GI for further evaluation.  Check labs Omeprazole daily

## 2012-11-26 LAB — CBC WITH DIFFERENTIAL/PLATELET
Basophils Absolute: 0.1 10*3/uL (ref 0.0–0.1)
Eosinophils Absolute: 0.4 10*3/uL (ref 0.0–0.7)
Hemoglobin: 13.8 g/dL (ref 12.0–15.0)
Lymphocytes Relative: 31.2 % (ref 12.0–46.0)
MCHC: 33.2 g/dL (ref 30.0–36.0)
Monocytes Relative: 8.6 % (ref 3.0–12.0)
Neutro Abs: 4.4 10*3/uL (ref 1.4–7.7)
Neutrophils Relative %: 54.8 % (ref 43.0–77.0)
RDW: 12.6 % (ref 11.5–14.6)

## 2012-11-26 LAB — HEPATIC FUNCTION PANEL
AST: 21 U/L (ref 0–37)
Alkaline Phosphatase: 82 U/L (ref 39–117)
Bilirubin, Direct: 0 mg/dL (ref 0.0–0.3)
Total Bilirubin: 0.6 mg/dL (ref 0.3–1.2)

## 2012-11-26 LAB — BASIC METABOLIC PANEL
CO2: 25 mEq/L (ref 19–32)
Calcium: 9.2 mg/dL (ref 8.4–10.5)
Creatinine, Ser: 0.8 mg/dL (ref 0.4–1.2)
GFR: 80.96 mL/min (ref >60.00)
Glucose, Bld: 93 mg/dL (ref 70–99)
Sodium: 136 mEq/L (ref 135–145)

## 2012-11-26 MED ORDER — GI COCKTAIL ~~LOC~~
30.0000 mL | Freq: Once | ORAL | Status: AC
  Administered 2012-11-26: 30 mL via ORAL

## 2012-11-26 NOTE — Addendum Note (Signed)
Addended by: Arnette Norris on: 11/26/2012 10:53 AM   Modules accepted: Orders

## 2012-12-11 HISTORY — PX: UPPER GASTROINTESTINAL ENDOSCOPY: SHX188

## 2012-12-20 ENCOUNTER — Other Ambulatory Visit (INDEPENDENT_AMBULATORY_CARE_PROVIDER_SITE_OTHER): Payer: BC Managed Care – PPO

## 2012-12-20 ENCOUNTER — Ambulatory Visit (INDEPENDENT_AMBULATORY_CARE_PROVIDER_SITE_OTHER): Payer: BC Managed Care – PPO | Admitting: Internal Medicine

## 2012-12-20 ENCOUNTER — Encounter: Payer: Self-pay | Admitting: Internal Medicine

## 2012-12-20 VITALS — BP 106/70 | HR 68 | Ht 70.0 in | Wt 238.2 lb

## 2012-12-20 DIAGNOSIS — K219 Gastro-esophageal reflux disease without esophagitis: Secondary | ICD-10-CM

## 2012-12-20 DIAGNOSIS — D509 Iron deficiency anemia, unspecified: Secondary | ICD-10-CM

## 2012-12-20 DIAGNOSIS — R1310 Dysphagia, unspecified: Secondary | ICD-10-CM

## 2012-12-20 DIAGNOSIS — R195 Other fecal abnormalities: Secondary | ICD-10-CM

## 2012-12-20 MED ORDER — DEXLANSOPRAZOLE 60 MG PO CPDR: 60.0000 mg | DELAYED_RELEASE_CAPSULE | Freq: Every day | ORAL | Status: DC

## 2012-12-20 NOTE — Progress Notes (Signed)
Subjective:  Lelon Perla, DO - referring provider   Patient ID: Amber Hebert, female    DOB: 09-06-1961, 52 y.o.   MRN: [id number]  HPI This 89 yo woman is here with her boyfriend to be evaluated for refractory heartburn and GERD. In the past she was able to use Nexium which worked very well. This was changed due to insurance issues. She has since tried omeprazole, Aciphex, ranitidine without help. She c/o intermittent mid-sternal sharp pains also, with solid food dysphagia at times. Mid-sternal impact point. She also has a hx of iron-deficiency anemia successfuly treated with infusions (Dr. Myna Hidalgo). Though she denies menorrhagia she had an endometrial ablation. She apparently had a GI work-up at onset of anemia dx but cannot remember by whom. EGD and colonoscopy. Reports a hx of a hiatal hernia dx. No capsule endoscopy. When last evaluated by her PCP a DRE showed heme + stool.  Allergies  Allergen Reactions  . Fluzone     REACTION: anaphylaxsis, hives  . Oseltamivir Phosphate   . Penicillins   . Sulfonamide Derivatives    Outpatient Prescriptions Prior to Visit  Medication Sig Dispense Refill  . Multiple Vitamin (MULTIVITAMIN) capsule Take 1 capsule by mouth daily.      Marland Kitchen omeprazole (PRILOSEC) 40 MG capsule Take 40 mg by mouth daily.      . Pseudoeph-Doxylamine-DM-APAP (NYQUIL PO) Take 1 application by mouth daily as needed.        . Pseudoephedrine-APAP-DM (DAYQUIL MULTI-SYMPTOM COLD/FLU PO) Take 1 application by mouth daily.         Last reviewed on 12/20/2012 10:53 AM by Iva Boop, MD Past Medical History  Diagnosis Date  . Iron deficiency anemia   . GERD (gastroesophageal reflux disease)   . Incontinence of urine   . Chronic bronchitis    Past Surgical History  Procedure Date  . External ear surgery     Ear Bone Surgery  . Upper gastrointestinal endoscopy   . Cesarean section   . Total knee arthroplasty     right  . Colonoscopy     probably   History    Social History  . Marital Status: Married    Spouse Name: N/A    Number of Children: 4  . 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 Smoking status: Never Smoker   . Smokeless tobacco: Never Used  . Alcohol Use: No  . Drug Use: No  . Sexually Active: None   Other Topics Concern  . 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 Esophageal cancer Brother   . Breast cancer Maternal Grandmother   . Breast cancer Paternal Grandmother   . Aneurysm Father   . Asthma Mother   . Diabetes Mother    Review of Systems +sinus problems, fatigue, pedal edema, voice change All other ros negative or as per hpi    Objective:   Physical Exam General:  Well-developed, well-nourished and in no acute distress Eyes:  anicteric. ENT:   Mouth and posterior pharynx free of lesions.  Neck:   supple w/o thyromegaly or mass.  Lungs: Clear to auscultation bilaterally. Heart:  S1S2, no rubs, murmurs, gallops. Abdomen:  soft, non-tender, no hepatosplenomegaly, hernia, or mass and BS+.  Lymph:  no cervical or supraclavicular adenopathy. Extremities:  no edema Neuro:  A&O x 3.  Psych:  appropriate mood and  Affect.   Data Reviewed: Requesting prior GI workup Lab Results  Component Value Date   WBC 8.0 11/25/2012   HGB 13.8 11/25/2012   HCT 41.4 11/25/2012   MCV 93.7 11/25/2012   PLT 342.0 11/25/2012   Lab Results  Component Value Date   FERRITIN 199 11/11/2012   Have looked at hematology consult notes and PCP notes 2013    Assessment & Plan:   1. Dysphagia  Ambulatory referral to Gastroenterology, Tissue transglutaminase, IgA, IgA  2. GERD (gastroesophageal reflux disease)  Tissue transglutaminase, IgA, IgA  3. Heme + stool  Tissue transglutaminase, IgA, IgA  4. Iron deficiency anemia  Tissue transglutaminase, IgA, IgA     1. She needs an EGD and dilation at least. 2. Depending upon when and results of prior colonoscopy - may not need. Awaiting records review of requested GI work-up - she knows she was an [facility] patient then. 3. If has a large hiatal hernia could have Cameron's erosions causing anemia 4. Dexilant 60 mg samples - she has failed omeprazole, rabeprazole and ranitidine. May need to get a prior auth for Nexium since works so well - I was out of samples. [place] a possibility depending upon current formulary.   I appreciate the opportunity to care for this patient.  YN:WGNFAO Lowne, DO and Arlan Organ, MD

## 2012-12-20 NOTE — Patient Instructions (Addendum)
Your physician has requested that you go to the basement for the following lab work before leaving today: TTG, IGA  You have been scheduled for an endoscopy with propofol. Please follow written instructions given to you at your visit today. If you use inhalers (even only as needed) or a CPAP machine, please bring them with you on the day of your procedure.  Today you have been given samples of Dexilant to take one a day . Before breakfast instead of your Prilosec.  Thank you for choosing me and [facility].  Iva Boop, M.D., [facility]

## 2012-12-21 ENCOUNTER — Encounter: Payer: Self-pay | Admitting: Internal Medicine

## 2012-12-23 NOTE — Progress Notes (Signed)
Quick Note:  This test is negative for celiac disease I do not think she has that (gluten allergy) Please let her know ______

## 2013-01-09 ENCOUNTER — Telehealth: Payer: Self-pay

## 2013-01-09 ENCOUNTER — Ambulatory Visit (AMBULATORY_SURGERY_CENTER): Payer: BC Managed Care – PPO | Admitting: Internal Medicine

## 2013-01-09 ENCOUNTER — Encounter: Payer: Self-pay | Admitting: Internal Medicine

## 2013-01-09 VITALS — BP 104/62 | HR 70 | Temp 97.4°F | Resp 19 | Ht 70.0 in | Wt 237.0 lb

## 2013-01-09 DIAGNOSIS — R1310 Dysphagia, unspecified: Secondary | ICD-10-CM

## 2013-01-09 DIAGNOSIS — K449 Diaphragmatic hernia without obstruction or gangrene: Secondary | ICD-10-CM

## 2013-01-09 DIAGNOSIS — K2270 Barrett's esophagus without dysplasia: Secondary | ICD-10-CM

## 2013-01-09 DIAGNOSIS — K219 Gastro-esophageal reflux disease without esophagitis: Secondary | ICD-10-CM

## 2013-01-09 HISTORY — DX: Barrett's esophagus without dysplasia: K22.70

## 2013-01-09 MED ORDER — SODIUM CHLORIDE 0.9 % IV SOLN: 500.0000 mL | INTRAVENOUS | Status: DC

## 2013-01-09 MED ORDER — DEXLANSOPRAZOLE 60 MG PO CPDR: 60.0000 mg | DELAYED_RELEASE_CAPSULE | Freq: Every day | ORAL | Status: DC

## 2013-01-09 NOTE — Progress Notes (Signed)
Patient did not experience any of the following events: a burn prior to discharge; a fall within the facility; wrong site/side/patient/procedure/implant event; or a hospital transfer or hospital admission upon discharge from the facility. (G8907) Patient did not have preoperative order for IV antibiotic SSI prophylaxis. (G8918)  

## 2013-01-09 NOTE — Op Note (Signed)
 [facility] [address] [place], [address]   ENDOSCOPY PROCEDURE REPORT  PATIENT: Amber, Hebert  MR#: [id number] BIRTHDATE: 09-21-1961 , 51  yrs. old GENDER: Female ENDOSCOPIST: Iva Boop, MD, [facility] REFERRED BY:  Loreen Freud, DO PROCEDURE DATE:  01/09/2013 PROCEDURE:  EGD w/ biopsy ASA CLASS:     Class II INDICATIONS:  Dysphagia.  Reflux. Heme + stool MEDICATIONS: propofol (Diprivan) 250mg  IV, MAC sedation, administered by CRNA, These medications were titrated to patient response per physician's verbal order, and Robinul 0.2 mg IV TOPICAL ANESTHETIC: Cetacaine Spray  DESCRIPTION OF PROCEDURE: After the risks benefits and alternatives of the procedure were thoroughly explained, informed consent was obtained.  The LB [id number]-H180 [id number] endoscope was introduced through the mouth and advanced to the second portion of the duodenum. Without limitations.  The instrument was slowly withdrawn as the mucosa was fully examined.        ESOPHAGUS: There was evidence of suspected Barrett's esophagus in the lower third of the esophagus.  Multiple biopsies were performed using cold forceps.  Sample sent for histology.   A 5 cm hiatal hernia was noted.  The remainder of the upper endoscopy exam was otherwise normal. Retroflexed views revealed a hiatal hernia.     The scope was then withdrawn from the patient and the procedure completed.  COMPLICATIONS: There were no complications. ENDOSCOPIC IMPRESSION: 1.   There was evidence of suspected Barrett's esophagus; multiple biopsies 2.   5 cm hiatal hernia 3.   The remainder of the upper endoscopy exam was otherwise normal  RECOMMENDATIONS: 1.  Upper GI Series to be scheduled 2.  Continue PPI - Dexilant 60 mg daily 3. Review prior colonoscopy  eSigned:  Iva Boop, MD, [facility] 01/09/2013 2:39 PM  XB:JYNWGN R Laury Axon, DO and The Patient

## 2013-01-09 NOTE — Telephone Encounter (Signed)
Per Endoscopy report patient to be scheduled for UGI series per Dr. Leone Payor.  She is advised of appt at [facility] radiology for 01/15/13 9:15 and to be NPO after midnight.  Patient verbalized understanding and properly repeated all instructions

## 2013-01-09 NOTE — Patient Instructions (Addendum)
There was a large hiatal hernia and also some changes in the lining of the esophagus from acid reflux. You may have what is called Barrett's esophagus which can very rarely lead to esophageal cancer. The biopsies will tell us if this is present. While this sounds scary it is extremely rare to get cancer from this. I will explain more when we know for certain but please read the handout about this.  Since the Dexilant has helped we will continue that and provide a cost-saving coupon.  Will call from office with results and plans. We will also arrange for you to have an upper GI series xray.  I still have not seen the colonoscopy results but will double check on this.  Thank you for choosing me and [facility].  Iva Boop, MD, FACG   YOU HAD AN ENDOSCOPIC PROCEDURE TODAY AT THE [facility]: Refer to the procedure report that was given to you for any specific questions about what was found during the examination.  If the procedure report does not answer your questions, please call your gastroenterologist to clarify.  If you requested that your care partner not be given the details of your procedure findings, then the procedure report has been included in a sealed envelope for you to review at your convenience later.  YOU SHOULD EXPECT: Some feelings of bloating in the abdomen. Passage of more gas than usual.  Walking can help get rid of the air that was put into your GI tract during the procedure and reduce the bloating. If you had a lower endoscopy (such as a colonoscopy or flexible sigmoidoscopy) you may notice spotting of blood in your stool or on the toilet paper. If you underwent a bowel prep for your procedure, then you may not have a normal bowel movement for a few days.  DIET: Your first meal following the procedure should be a light meal and then it is ok to progress to your normal diet.  A half-sandwich or bowl of soup is an example of a good first meal.  Heavy  or fried foods are harder to digest and may make you feel nauseous or bloated.  Likewise meals heavy in dairy and vegetables can cause extra gas to form and this can also increase the bloating.  Drink plenty of fluids but you should avoid alcoholic beverages for 24 hours.  ACTIVITY: Your care partner should take you home directly after the procedure.  You should plan to take it easy, moving slowly for the rest of the day.  You can resume normal activity the day after the procedure however you should NOT DRIVE or use heavy machinery for 24 hours (because of the sedation medicines used during the test).    SYMPTOMS TO REPORT IMMEDIATELY: A gastroenterologist can be reached at any hour.  During normal business hours, 8:30 AM to 5:00 PM Monday through Friday, call [phone].  After hours and on weekends, please call the GI answering service at [phone] who will take a message and have the physician on call contact you.     Following upper endoscopy (EGD)  Vomiting of blood or coffee ground material  New chest pain or pain under the shoulder blades  Painful or persistently difficult swallowing  New shortness of breath  Fever of 100F or higher  Black, tarry-looking stools  FOLLOW UP: If any biopsies were taken you will be contacted by phone or by letter within the next 1-3 weeks.  Call your gastroenterologist  if you have not heard about the biopsies in 3 weeks.  Our staff will call the home number listed on your records the next business day following your procedure to check on you and address any questions or concerns that you may have at that time regarding the information given to you following your procedure. This is a courtesy call and so if there is no answer at the home number and we have not heard from you through the emergency physician on call, we will assume that you have returned to your regular daily activities without incident.  SIGNATURES/CONFIDENTIALITY: You and/or your  care partner have signed paperwork which will be entered into your electronic medical record.  These signatures attest to the fact that that the information above on your After Visit Summary has been reviewed and is understood.  Full responsibility of the confidentiality of this discharge information lies with you and/or your care-partner.    Information on reflux & hiatal hernia given to you

## 2013-01-09 NOTE — Progress Notes (Signed)
Called to room to assist during endoscopic procedure.  Patient ID and intended procedure confirmed with present staff. Received instructions for my participation in the procedure from the performing physician.  

## 2013-01-10 ENCOUNTER — Telehealth: Payer: Self-pay | Admitting: *Deleted

## 2013-01-10 NOTE — Telephone Encounter (Signed)
  Follow up Call-  Call back number 01/09/2013  Post procedure Call Back phone  # [phone]  Permission to leave phone message Yes     Patient questions:  Do you have a fever, pain , or abdominal swelling? no Pain Score  0 *  Have you tolerated food without any problems? yes  Have you been able to return to your normal activities? yes  Do you have any questions about your discharge instructions: Diet   no Medications  no Follow up visit  no  Do you have questions or concerns about your Care? no  Actions: * If pain score is 4 or above: No action needed, pain <4.

## 2013-01-15 ENCOUNTER — Ambulatory Visit (HOSPITAL_COMMUNITY)
Admission: RE | Admit: 2013-01-15 | Discharge: 2013-01-15 | Disposition: A | Discharge status: Home / Self Care | Payer: BC Managed Care – PPO | Source: Ambulatory Visit | Attending: Internal Medicine | Admitting: Internal Medicine

## 2013-01-15 DIAGNOSIS — K219 Gastro-esophageal reflux disease without esophagitis: Secondary | ICD-10-CM | POA: Insufficient documentation

## 2013-01-15 DIAGNOSIS — K449 Diaphragmatic hernia without obstruction or gangrene: Secondary | ICD-10-CM | POA: Insufficient documentation

## 2013-01-15 DIAGNOSIS — R6889 Other general symptoms and signs: Secondary | ICD-10-CM | POA: Insufficient documentation

## 2013-01-15 DIAGNOSIS — R1310 Dysphagia, unspecified: Secondary | ICD-10-CM

## 2013-01-16 ENCOUNTER — Encounter: Payer: Self-pay | Admitting: Internal Medicine

## 2013-01-16 DIAGNOSIS — K2270 Barrett's esophagus without dysplasia: Secondary | ICD-10-CM | POA: Insufficient documentation

## 2013-01-16 NOTE — Progress Notes (Signed)
Quick Note:  Barrett's Repeat routine EGD 3 years 12/2014 ______

## 2013-01-16 NOTE — Progress Notes (Signed)
Quick Note:  No new info here - good news She does have Barrett's esophagus  Letter to come Will need repeat EGD exam in 3 years  Stay on Dexilant and see me in March please  We have not been able to locate colonoscopy she had done in past - was [facility] patient then but have not turned up the results  Any chance you or PJ could query their records people about this? ______

## 2013-01-17 ENCOUNTER — Other Ambulatory Visit: Payer: Self-pay

## 2013-01-17 DIAGNOSIS — R1310 Dysphagia, unspecified: Secondary | ICD-10-CM

## 2013-01-17 DIAGNOSIS — K219 Gastro-esophageal reflux disease without esophagitis: Secondary | ICD-10-CM

## 2013-01-17 MED ORDER — DEXLANSOPRAZOLE 60 MG PO CPDR: 60.0000 mg | DELAYED_RELEASE_CAPSULE | Freq: Every day | ORAL | Status: DC

## 2013-01-30 ENCOUNTER — Telehealth: Payer: Self-pay | Admitting: Internal Medicine

## 2013-01-30 DIAGNOSIS — R1310 Dysphagia, unspecified: Secondary | ICD-10-CM

## 2013-01-30 DIAGNOSIS — K219 Gastro-esophageal reflux disease without esophagitis: Secondary | ICD-10-CM

## 2013-01-30 MED ORDER — DEXLANSOPRAZOLE 60 MG PO CPDR: 60.0000 mg | DELAYED_RELEASE_CAPSULE | Freq: Every day | ORAL | Status: DC

## 2013-01-30 NOTE — Telephone Encounter (Signed)
LM for patient that medicine has been sent to [facility].  Apologized that did not go through originally.  Told her to call back with any more questions or concerns.

## 2013-03-07 ENCOUNTER — Ambulatory Visit: Payer: BC Managed Care – PPO | Admitting: Internal Medicine

## 2013-03-12 ENCOUNTER — Other Ambulatory Visit: Payer: BC Managed Care – PPO | Admitting: Lab

## 2013-03-12 ENCOUNTER — Ambulatory Visit: Payer: BC Managed Care – PPO | Admitting: Hematology & Oncology

## 2013-03-17 ENCOUNTER — Ambulatory Visit (HOSPITAL_BASED_OUTPATIENT_CLINIC_OR_DEPARTMENT_OTHER): Payer: BC Managed Care – PPO | Admitting: Medical

## 2013-03-17 ENCOUNTER — Other Ambulatory Visit (HOSPITAL_BASED_OUTPATIENT_CLINIC_OR_DEPARTMENT_OTHER): Payer: BC Managed Care – PPO | Admitting: Lab

## 2013-03-17 VITALS — BP 110/62 | HR 72 | Temp 97.8°F | Resp 16 | Ht 70.0 in | Wt 240.0 lb

## 2013-03-17 DIAGNOSIS — D509 Iron deficiency anemia, unspecified: Secondary | ICD-10-CM

## 2013-03-17 LAB — IRON AND TIBC
%SAT: 26 % (ref 20–55)
Iron: 70 ug/dL (ref 42–145)

## 2013-03-17 LAB — CBC WITH DIFFERENTIAL (CANCER CENTER ONLY)
BASO%: 0.9 % (ref 0.0–2.0)
EOS%: 5.2 % (ref 0.0–7.0)
LYMPH%: 29.5 % (ref 14.0–48.0)
MCH: 31.2 pg (ref 26.0–34.0)
MCHC: 33 g/dL (ref 32.0–36.0)
MCV: 95 fL (ref 81–101)
MONO%: 8.9 % (ref 0.0–13.0)
NEUT#: 3.9 10*3/uL (ref 1.5–6.5)
Platelets: 283 10*3/uL (ref 145–400)
RBC: 4.45 10*6/uL (ref 3.70–5.32)
RDW: 12.7 % (ref 11.1–15.7)

## 2013-03-17 LAB — FERRITIN: Ferritin: 174 ng/mL (ref 10–291)

## 2013-03-17 NOTE — Progress Notes (Signed)
DIAGNOSIS:  Recurrent iron deficiency anemia.  CURRENT THERAPY:  IV iron as indicated.  INTERIM HISTORY: Amber Hebert presents today for an office followup visit.  Overall, she, reports, that she's doing relatively well.  She does not report any new problems.  She remains on Dexilant.  For her acid reflux.  She does have some intermittent nausea with this.  She does report some fatigue, however, this is chronic.  The last time she had IV iron was back in April of 2013.  Her last iron panel.  Back in December revealed a ferritin of 199 an iron of 108, with 40% saturation.  She, reports, a decent, appetite.  She, not reporting any active, vomiting, any diarrhea, constipation, chest pain, shortness breath, or cough.  She denies any fevers, chills, or night sweats.  She denies any abdominal pain, any obvious, or abnormal bleeding.  She denies any headaches, visual changes, or rashes.  Review of Systems: Constitutional:Negative for malaise/fatigue, fever, chills, weight loss, diaphoresis, activity change, appetite change, and unexpected weight change.  HEENT: Negative for double vision, blurred vision, visual loss, ear pain, tinnitus, congestion, rhinorrhea, epistaxis sore throat or sinus disease, oral pain/lesion, tongue soreness Respiratory: Negative for cough, chest tightness, shortness of breath, wheezing and stridor.  Cardiovascular: Negative for chest pain, palpitations, leg swelling, orthopnea, PND, DOE or claudication Gastrointestinal: Negative for nausea, vomiting, abdominal pain, diarrhea, constipation, blood in stool, melena, hematochezia, abdominal distention, anal bleeding, rectal pain, anorexia and hematemesis.  Genitourinary: Negative for dysuria, frequency, hematuria,  Musculoskeletal: Negative for myalgias, back pain, joint swelling, arthralgias and gait problem.  Skin: Negative for rash, color change, pallor and wound.  Neurological:. Negative for dizziness/light-headedness, tremors,  seizures, syncope, facial asymmetry, speech difficulty, weakness, numbness, headaches and paresthesias.  Hematological: Negative for adenopathy. Does not bruise/bleed easily.  Psychiatric/Behavioral:  Negative for depression, no loss of interest in normal activity or change in sleep pattern.   Physical Exam: This is a 52 year old, obese, white female, in no obvious distress Vitals: Temperature 97.8 degrees, pulse 72, respirations 16, blood pressure 110/62.  Weight 240 pounds HEENT reveals a normocephalic, atraumatic skull, no scleral icterus, no oral lesions  Neck is supple without any cervical or supraclavicular adenopathy.  Lungs are clear to auscultation bilaterally. There are no wheezes, rales or rhonci Cardiac is regular rate and rhythm with a normal S1 and S2. There are no murmurs, rubs, or bruits.  Abdomen is soft with good bowel sounds, there is no palpable mass. There is no palpable hepatosplenomegaly. There is no palpable fluid wave.  Musculoskeletal no tenderness of the spine, ribs, or hips.  Extremities there are no clubbing, cyanosis, or edema.  Skin no petechia, purpura or ecchymosis Neurologic is nonfocal.  Laboratory Data: White count 6.9, hemoglobin 13.9, hematocrit 42.1, platelets 283,000  Current Outpatient Prescriptions on File Prior to Visit  Medication Sig Dispense Refill  . dexlansoprazole (DEXILANT) 60 MG capsule Take 1 capsule (60 mg total) by mouth daily before breakfast.  30 capsule  5  . Multiple Vitamin (MULTIVITAMIN) capsule Take 1 capsule by mouth daily.      . Pseudoeph-Doxylamine-DM-APAP (NYQUIL PO) Take 1 application by mouth daily as needed.        . Pseudoephedrine-APAP-DM (DAYQUIL MULTI-SYMPTOM COLD/FLU PO) Take 1 application by mouth daily.         No current facility-administered medications on file prior to visit.   Assessment/Plan: This is a 52 year old, white female, with the following issues:  #1.  History of iron deficiency anemia.  Her blood  count is holding very stable.  It has been one year, since she's had IV iron.  We are checking an iron panel on her.    #2.  Followup.  We will follow back up with her in about 4 months, but before then should there be questions or concerns.

## 2013-03-19 ENCOUNTER — Telehealth: Payer: Self-pay | Admitting: *Deleted

## 2013-03-19 NOTE — Telephone Encounter (Addendum)
Message copied by Mirian Capuchin on Wed Mar 19, 2013 10:37 AM ------      Message from: Josph Macho      Created: Tue Mar 18, 2013  6:35 PM       Call - iron is ok!!  Cindee Lame ------This message left on pt's home answering machine.

## 2013-05-22 ENCOUNTER — Telehealth: Payer: Self-pay | Admitting: Internal Medicine

## 2013-05-22 NOTE — Telephone Encounter (Signed)
Patient reports that she had a anaphalactic reaction to Dexilant.  She has been taking for several months.  She reports that she had a rash that progressed to hives, SOB, and was admitted to ER at [facility].  They told her that this was from Dexilant.  She took the dexilant in the am as she always does, developed rash late morning then her symptoms progressed to full ananaphylaxis around 2 pm.  She has not had any since Monday, but reports that she has hives coming back now.  She is advised that this should not be from Dexilant and she needs to consider other sources that are causing her reaction.  She is advised to continue on her benadryl and ok to take Pepcid that was prescribed in the ER.  She wants to know what she can take for her reflux.  She is advised that she needs to avoid OTC prevacid until dexilant allergy proven otherwise.  I have asked her to see her allergist as soon as possible.   Dr. Leone Payor do you have alternate PPI or antireflux advise

## 2013-05-22 NOTE — Telephone Encounter (Signed)
I have left detailed message for the patient and she will call back for any questions

## 2013-05-22 NOTE — Telephone Encounter (Signed)
I cannot recommend a PPI at this time because of possible reaction to Dexilant and there could be a class effect. pepcid is ok She should ask the allergist about PPI once they figure out what her reaction is to.

## 2013-07-14 ENCOUNTER — Other Ambulatory Visit: Payer: BC Managed Care – PPO | Admitting: Lab

## 2013-07-14 ENCOUNTER — Telehealth: Payer: Self-pay | Admitting: Hematology & Oncology

## 2013-07-14 ENCOUNTER — Ambulatory Visit: Payer: BC Managed Care – PPO | Admitting: Hematology & Oncology

## 2013-07-14 NOTE — Telephone Encounter (Signed)
Called pt to reschedule 8-4 she said her brother died. She said she would call back to schedule

## 2013-12-26 ENCOUNTER — Encounter: Payer: Self-pay | Admitting: Nurse Practitioner

## 2013-12-26 ENCOUNTER — Ambulatory Visit (INDEPENDENT_AMBULATORY_CARE_PROVIDER_SITE_OTHER): Payer: BC Managed Care – PPO | Admitting: Nurse Practitioner

## 2013-12-26 VITALS — BP 116/72 | HR 68 | Ht 71.0 in | Wt 238.0 lb

## 2013-12-26 DIAGNOSIS — R079 Chest pain, unspecified: Secondary | ICD-10-CM | POA: Insufficient documentation

## 2013-12-26 DIAGNOSIS — K219 Gastro-esophageal reflux disease without esophagitis: Secondary | ICD-10-CM | POA: Insufficient documentation

## 2013-12-26 MED ORDER — SUCRALFATE 1 G PO TABS: ORAL_TABLET | ORAL | Status: DC

## 2013-12-26 NOTE — Progress Notes (Signed)
     History of Present Illness:  Patient is a 53 year old female known to Dr. Leone PayorGessner. She had an upper endoscopy January 2014 for dysphagia, reflux, and hemoccult-positive stools. Findings included Barrett's esophagus (+ biopsies) and a 5 cm hiatal hernia. UGI series in Jan 2014 revealed only a small sliding hiatal hernia. Patient has been asymptomatic on daily PPI.   Patient comes in today for evaluation of chest pain. A few nights ago, while at work, she developed acute chest pain. 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 The pain radiated up into her jaws and shoulders. Patient went home, took 4 ibuprofen plus a Pepcid. It was difficult to lay flat because of chest pressure and some associated shortness of breath when lying flat. The next morning patient had ongoing symptoms. Any movement at all caused sharp chest pain, even deep breaths exacerbated the pain. Patient [place] emergency [place] in [place]. I do not have those records but patient tells me that her labs, chest x-ray, and what sounds like a chest CT scan were all unremarkable. Patient was given Vicodin, it did not help. She was subsequently given morphine plus a GI cocktail. Pain eventually subsided, patient was advised to followup with GI. The pain was not typical of her GERD symptoms though she does endorse some recent mild "indigestion". She is on chronic PPI therapy. No odynophagia. No dysphagia. No significant chest pain since leaving ED, just feels a little sore in her chest. Tolerating diet.  Current Medications, Allergies, Past Medical History, Past Surgical History, Family History and Social History were reviewed in [facility] electronic medical record.  Physical Exam: General: Pleasant, well developed , white female in no acute distress Head: Normocephalic and atraumatic Eyes:  sclerae anicteric, conjunctiva pink  Ears: Normal auditory acuity Lungs: Clear throughout to  auscultation Heart: Regular rate and rhythm Abdomen: Soft, non distended, non-tender. No masses, no hepatomegaly. Normal bowel sounds Musculoskeletal: Symmetrical with no gross deformities  No chest wall tenderness Extremities: No edema  Neurological: Alert oriented x 4, grossly nonfocal Psychological:  Alert and cooperative. Normal mood and affect  Assessment and Recommendations:  131. 53 year old female with GERD / Barrett's on daily PPI.  2. Recent episode of severe chest pain radiating into jaws / shoulders and associated with SOB. [place] Cardiac workup negative per patient. Sounds like she even had a chest CTscan. I have requested records. Patient agrees that episode was atypical for GERD though she does admit to some recent indigestion. Continue daily PPI, will add Carafate. I encouraged patient to contact PCP to make sure no additional cardiac workup is needed. I will scan ED records into Epic once received.

## 2013-12-26 NOTE — Patient Instructions (Signed)
[facility] [address] [facility] Carafate tablets.   [facility] [place] [facility].  [name] will review them.  Please call midweek next week and ask for [name] with a progress report.

## 2013-12-27 ENCOUNTER — Encounter: Payer: Self-pay | Admitting: Nurse Practitioner

## 2013-12-29 NOTE — Progress Notes (Signed)
Agree with Ms. Guenther's assessment and plan.  Sounds like it could be a symptomatic hiatal hernia but prior evaluation has not revealed one sized such that would suspect that. Await records review.  Iva Booparl E. Gessner, MD, Clementeen GrahamFACG

## 2014-01-02 ENCOUNTER — Telehealth: Payer: Self-pay | Admitting: Nurse Practitioner

## 2014-01-05 NOTE — Telephone Encounter (Signed)
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  [name] sugests that she avoid constipation by using Miralax, stool softners, and even Hydrocortisone suppositories if she is having discomfort.  I left my name and number if she has any further questions.

## 2015-03-24 ENCOUNTER — Encounter: Payer: Self-pay | Admitting: Physician Assistant

## 2015-03-24 ENCOUNTER — Ambulatory Visit (HOSPITAL_BASED_OUTPATIENT_CLINIC_OR_DEPARTMENT_OTHER)
Admission: RE | Admit: 2015-03-24 | Discharge: 2015-03-24 | Disposition: A | Discharge status: Home / Self Care | Payer: Managed Care, Other (non HMO) | Source: Ambulatory Visit | Attending: Physician Assistant | Admitting: Physician Assistant

## 2015-03-24 ENCOUNTER — Telehealth: Payer: Self-pay | Admitting: Physician Assistant

## 2015-03-24 ENCOUNTER — Ambulatory Visit (INDEPENDENT_AMBULATORY_CARE_PROVIDER_SITE_OTHER): Payer: Managed Care, Other (non HMO) | Admitting: Physician Assistant

## 2015-03-24 VITALS — BP 118/78 | HR 78 | Temp 98.3°F | Resp 18 | Ht 70.0 in | Wt 242.2 lb

## 2015-03-24 DIAGNOSIS — R531 Weakness: Secondary | ICD-10-CM | POA: Insufficient documentation

## 2015-03-24 DIAGNOSIS — R05 Cough: Secondary | ICD-10-CM | POA: Insufficient documentation

## 2015-03-24 DIAGNOSIS — N951 Menopausal and female climacteric states: Secondary | ICD-10-CM | POA: Diagnosis not present

## 2015-03-24 DIAGNOSIS — R053 Chronic cough: Secondary | ICD-10-CM

## 2015-03-24 DIAGNOSIS — J208 Acute bronchitis due to other specified organisms: Principal | ICD-10-CM

## 2015-03-24 DIAGNOSIS — B9689 Other specified bacterial agents as the cause of diseases classified elsewhere: Secondary | ICD-10-CM

## 2015-03-24 LAB — CBC WITH DIFFERENTIAL/PLATELET
BASOS ABS: 0.1 10*3/uL (ref 0.0–0.1)
Basophils Relative: 1.1 % (ref 0.0–3.0)
EOS ABS: 0.5 10*3/uL (ref 0.0–0.7)
Eosinophils Relative: 8.3 % — ABNORMAL HIGH (ref 0.0–5.0)
HEMATOCRIT: 41 % (ref 36.0–46.0)
HEMOGLOBIN: 13.8 g/dL (ref 12.0–15.0)
LYMPHS ABS: 1.8 10*3/uL (ref 0.7–4.0)
Lymphocytes Relative: 27.5 % (ref 12.0–46.0)
MCHC: 33.7 g/dL (ref 30.0–36.0)
MCV: 88.7 fl (ref 78.0–100.0)
MONO ABS: 0.8 10*3/uL (ref 0.1–1.0)
Monocytes Relative: 12.6 % — ABNORMAL HIGH (ref 3.0–12.0)
NEUTROS ABS: 3.2 10*3/uL (ref 1.4–7.7)
Neutrophils Relative %: 50.5 % (ref 43.0–77.0)
PLATELETS: 342 10*3/uL (ref 150.0–400.0)
RBC: 4.63 Mil/uL (ref 3.87–5.11)
RDW: 12.6 % (ref 11.5–15.5)
WBC: 6.4 10*3/uL (ref 4.0–10.5)

## 2015-03-24 LAB — FOLLICLE STIMULATING HORMONE: FSH: 61.6 m[IU]/mL

## 2015-03-24 MED ORDER — HYDROCODONE-HOMATROPINE 5-1.5 MG/5ML PO SYRP: 5.0000 mL | ORAL_SOLUTION | Freq: Three times a day (TID) | ORAL | Status: DC | PRN

## 2015-03-24 MED ORDER — BENZONATATE 100 MG PO CAPS: 100.0000 mg | ORAL_CAPSULE | Freq: Two times a day (BID) | ORAL | Status: DC | PRN

## 2015-03-24 MED ORDER — LEVOFLOXACIN 750 MG PO TABS: 750.0000 mg | ORAL_TABLET | Freq: Every day | ORAL | Status: DC

## 2015-03-24 NOTE — Assessment & Plan Note (Signed)
We'll check FSH level today. Continue over-the-counter supplement.

## 2015-03-24 NOTE — Progress Notes (Signed)
Patient presents to clinic today c/o 3 months of a chronic cough that is mainly nonproductive, but occasionally productive of clear sputum. Patient also endorses intermittent low-grade fevers and night sweats. Is not a smoker. Denies recent travel or sick contact. States she has been seen twice in urgent care over the past 3 months. Was diagnosed with acute bronchitis and given antibiotics but times without improvement in symptoms. Denies history of allergies. Patient does have history of Barrett's esophagus. Last follow-up with the GI 3 years ago. Endorses taking over-the-counter Nexium daily. Denies heartburn or globus. Denies epigastric pain, nausea or vomiting.  Patient also requesting check for menopause. Patient is status post ablation 15 years ago and has not had a menstrual period since.  Endorses hot flashes occasionally.  Past Medical History  Diagnosis Date  . Iron deficiency anemia   . GERD (gastroesophageal reflux disease)   . Incontinence of urine   . Chronic bronchitis   . Barrett's esophagus 01/09/2013    Current Outpatient Prescriptions on File Prior to Visit  Medication Sig Dispense Refill  . Multiple Vitamin (MULTIVITAMIN) capsule Take 1 capsule by mouth daily.    . Cholecalciferol (VITAMIN D) 1000 UNITS capsule Take 1,000 Units by mouth daily.    Marland Kitchen HYDROcodone-acetaminophen (NORCO) 7.5-325 MG per tablet Take 1 tablet by mouth every 6 (six) hours as needed for moderate pain.    . Pseudoeph-Doxylamine-DM-APAP (NYQUIL PO) Take 1 application by mouth daily as needed.      . Pseudoephedrine-APAP-DM (DAYQUIL MULTI-SYMPTOM COLD/FLU PO) Take 1 application by mouth daily.      . sucralfate (CARAFATE) 1 G tablet Take 1 tab before meals 3 times a day. (Patient not taking: Reported on 03/24/2015) 42 tablet 1   No current facility-administered medications on file prior to visit.    Allergies  Allergen Reactions  . Dexilant [Dexlansoprazole] Anaphylaxis, Hives and Swelling  .  Fluzone     REACTION: anaphylaxsis, hives  . Oseltamivir Phosphate   . Penicillins     Unkown  . Sulfonamide Derivatives Rash    Family History  Problem Relation Age of Onset  . Esophageal cancer Brother   . Breast cancer Maternal Grandmother   . Breast cancer Paternal Grandmother   . Aneurysm Father   . Asthma Mother   . Diabetes Mother     History   Social History  . Marital Status: Married    Spouse Name: N/A  . Number of Children: 4  . 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 Smoking status: Never Smoker   . Smokeless tobacco: Never Used  . Alcohol Use: No  . Drug Use: No  . Sexual Activity: Not on file   Other Topics Concern  . 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  All other ROS are negative.  BP 118/78 mmHg  Pulse 78  Temp(Src) 98.3 F (36.8 C) (Oral)  Resp 18  Ht  (1.778 m)  Wt 242 lb 3.2 oz (109.861 kg)  BMI 34.75 kg/m2  SpO2 98%  Physical Exam  Constitutional: She is oriented to person, place, and time and well-developed, well-nourished, and in no distress.  HENT:  Head: Normocephalic and atraumatic.  Right Ear: External ear and ear canal normal. A middle ear effusion is present.  Left  Ear: External ear and ear canal normal. A middle ear effusion is present.  Nose: Nose normal. Right sinus exhibits no maxillary sinus tenderness and no frontal sinus tenderness. Left sinus exhibits no maxillary sinus tenderness and no frontal sinus tenderness.  Mouth/Throat: Uvula is midline, oropharynx is clear and moist and mucous membranes are normal.  Eyes: Conjunctivae are normal.  Neck: Neck supple.  Cardiovascular: Normal rate, regular rhythm, normal heart sounds and intact distal pulses.   Pulmonary/Chest: Effort normal and breath sounds normal. No respiratory distress. She has no wheezes. She has no  rales. She exhibits no tenderness.  Lymphadenopathy:    She has no cervical adenopathy.  Neurological: She is alert and oriented to person, place, and time.  Skin: Skin is warm and dry. No rash noted.  Psychiatric: Affect normal.  Vitals reviewed.  Assessment/Plan: Menopausal symptom We'll check FSH level today. Continue over-the-counter supplement.   Chronic cough Unclear etiology. There is some evidence of upper respiratory infection and eustachian tube dysfunction present.  Patient already been on 2 rounds of antibiotics without change in symptoms. Is a nonsmoker. Does have significant history of Barrett's esophagus. We'll begin Tessalon for daytime cough. Hycodan for nighttime cough. Increase fluids. Increase Nexium to twice daily. Will obtain stat chest x-ray. Will initiate antibiotics based on x-ray result. Encouraged patient to follow-up with her GI doctor as repeat EGD is likely needed.

## 2015-03-24 NOTE — Assessment & Plan Note (Signed)
Unclear etiology. There is some evidence of upper respiratory infection and eustachian tube dysfunction present.  Patient already been on 2 rounds of antibiotics without change in symptoms. Is a nonsmoker. Does have significant history of Barrett's esophagus. We'll begin Tessalon for daytime cough. Hycodan for nighttime cough. Increase fluids. Increase Nexium to twice daily. Will obtain stat chest x-ray. Will initiate antibiotics based on x-ray result. Encouraged patient to follow-up with her GI doctor as repeat EGD is likely needed.

## 2015-03-24 NOTE — Telephone Encounter (Signed)
Discussed results with patient. Rx Levaquin.  Will follow-up in 1 week.

## 2015-03-24 NOTE — Progress Notes (Signed)
Pre visit review using our clinic review tool, if applicable. No additional management support is needed unless otherwise documented below in the visit note. 

## 2015-03-24 NOTE — Patient Instructions (Signed)
Please go to lab for blood work. Then go immediately downstairs for x-ray. I will call you with your results.  Stay well hydrated.   Use cough medications as directed -- Tessalon (daytime), Hycodan (Nighttime) Use Mucinex to help breakup congestion. Increase Nexium to twice daily over the next week. I would highly encourage you to follow-up with your GI for your Barrett's. We will start antibiotic based on x-ray results.

## 2015-03-31 ENCOUNTER — Encounter: Payer: Self-pay | Admitting: Physician Assistant

## 2015-03-31 ENCOUNTER — Ambulatory Visit (INDEPENDENT_AMBULATORY_CARE_PROVIDER_SITE_OTHER): Payer: Managed Care, Other (non HMO) | Admitting: Physician Assistant

## 2015-03-31 VITALS — BP 96/60 | HR 83 | Temp 98.2°F | Resp 16 | Ht 70.0 in | Wt 239.4 lb

## 2015-03-31 DIAGNOSIS — R05 Cough: Secondary | ICD-10-CM

## 2015-03-31 DIAGNOSIS — R053 Chronic cough: Secondary | ICD-10-CM

## 2015-03-31 MED ORDER — PREDNISONE 20 MG PO TABS: 40.0000 mg | ORAL_TABLET | Freq: Every day | ORAL | Status: DC

## 2015-03-31 NOTE — Progress Notes (Signed)
Patient with history of recurrent bronchitis and Barrett's esophagus presents to clinic today for follow-up of chronic cough. CXR revealed acute bronchitis. Was some enlargement of the mediastinum noted, but felt to be due to overlap of bony and vascular structures.  Patient was started on Levaquin for 1 week duration.  Patient endorses improvement in cough. Is able to rest now. Still having cough but much milder and less frequent.  Denies new symptoms.  Past Medical History  Diagnosis Date  . Iron deficiency anemia   . GERD (gastroesophageal reflux disease)   . Incontinence of urine   . Chronic bronchitis   . Barrett's esophagus 01/09/2013    Current Outpatient Prescriptions on File Prior to Visit  Medication Sig Dispense Refill  . benzonatate (TESSALON) 100 MG capsule Take 1 capsule (100 mg total) by mouth 2 (two) times daily as needed for cough. 20 capsule 0  . Cholecalciferol (VITAMIN D) 1000 UNITS capsule Take 1,000 Units by mouth daily.    Marland Kitchen esomeprazole (NEXIUM) 40 MG capsule Take 40 mg by mouth daily at 12 noon.    Marland Kitchen HYDROcodone-homatropine (HYCODAN) 5-1.5 MG/5ML syrup Take 5 mLs by mouth every 8 (eight) hours as needed for cough. 120 mL 0  . Multiple Vitamin (MULTIVITAMIN) capsule Take 1 capsule by mouth daily.     No current facility-administered medications on file prior to visit.    Allergies  Allergen Reactions  . Dexilant [Dexlansoprazole] Anaphylaxis, Hives and Swelling  . Fluzone     REACTION: anaphylaxsis, hives  . Oseltamivir Phosphate   . Penicillins     Unkown  . Sulfonamide Derivatives Rash    Family History  Problem Relation Age of Onset  . Esophageal cancer Brother   . Breast cancer Maternal Grandmother   . Breast cancer Paternal Grandmother   . Aneurysm Father   . Asthma Mother   . Diabetes Mother     History   Social History  . Marital Status: Married    Spouse Name: N/A  . Number of Children: 4  . 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 Smoking status: Never Smoker   . Smokeless tobacco: Never Used  . Alcohol Use: No  . Drug Use: No  . Sexual Activity: Not on file   Other Topics Concern  . 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  All other ROS are negative.  BP 96/60 mmHg  Pulse 83  Temp(Src) 98.2 F (36.8 C) (Oral)  Resp 16  Ht  (1.778 m)  Wt 239 lb 6.4 oz (108.591 kg)  BMI 34.35 kg/m2  SpO2 99%  Physical Exam  Constitutional: She is oriented to person, place, and time and well-developed, well-nourished, and in no distress.  HENT:  Head: Normocephalic and atraumatic.  Right Ear: External ear and ear canal normal. A middle ear effusion is present.  Left Ear: External ear and ear canal normal.  Nose: Nose normal.  Mouth/Throat: Uvula is midline, oropharynx is clear and moist and mucous membranes are normal.  Eyes: Conjunctivae are normal.  Cardiovascular: Normal rate, regular rhythm, normal heart sounds and intact distal pulses.   Pulmonary/Chest: Effort normal and breath sounds normal. No respiratory distress. She has no wheezes. She has no rales. She exhibits no tenderness.  Lymphadenopathy:  She has no cervical adenopathy.  Neurological: She is alert and oriented to person, place, and time.  Skin: Skin is warm and dry. No rash noted.  Psychiatric: Affect normal.  Vitals reviewed.   Recent Results (from the past 2160 hour(s))  CBC w/Diff     Status: Abnormal   Collection Time: 03/24/15  7:38 AM  Result Value Ref Range   WBC 6.4 4.0 - 10.5 K/uL   RBC 4.63 3.87 - 5.11 Mil/uL   Hemoglobin 13.8 12.0 - 15.0 g/dL   HCT 16.141.0 09.636.0 - 04.546.0 %   MCV 88.7 78.0 - 100.0 fl   MCHC 33.7 30.0 - 36.0 g/dL   RDW 40.912.6 81.111.5 - 91.415.5 %   Platelets 342.0 150.0 - 400.0 K/uL   Neutrophils Relative % 50.5 43.0 - 77.0 %   Lymphocytes Relative 27.5 12.0 - 46.0 %     Monocytes Relative 12.6 (H) 3.0 - 12.0 %   Eosinophils Relative 8.3 (H) 0.0 - 5.0 %   Basophils Relative 1.1 0.0 - 3.0 %   Neutro Abs 3.2 1.4 - 7.7 K/uL   Lymphs Abs 1.8 0.7 - 4.0 K/uL   Monocytes Absolute 0.8 0.1 - 1.0 K/uL   Eosinophils Absolute 0.5 0.0 - 0.7 K/uL   Basophils Absolute 0.1 0.0 - 0.1 K/uL  FSH     Status: None   Collection Time: 03/24/15  7:38 AM  Result Value Ref Range   FSH 61.6 mIU/ML    Comment: Female Reference Range:  1.4-18.1 mIU/mLFemale Reference Range:Follicular Phase          2.5-10.2 mIU/mLMidCycle Peak          3.4-33.4 mIU/mLLuteal Phase          1.5-9.1 mIU/mLPost Menopausal     23.0-116.3 mIU/mLPregnant          <0.3 mIU/mL    Assessment/Plan: Chronic cough Marked improvement over last week. Continue hycodan and Nexium. B?urst of prednisone given due to suspect of bronchospasm. If not resolving over this week will proceed with CT Chest and pulmonary referral.

## 2015-03-31 NOTE — Assessment & Plan Note (Signed)
Marked improvement over last week. Continue hycodan and Nexium. B?urst of prednisone given due to suspect of bronchospasm. If not resolving over this week will proceed with CT Chest and pulmonary referral.

## 2015-03-31 NOTE — Progress Notes (Signed)
Pre visit review using our clinic review tool, if applicable. No additional management support is needed unless otherwise documented below in the visit note. 

## 2015-03-31 NOTE — Patient Instructions (Addendum)
I am glad you are feeling better after the antibiotic.   Continue supportive measures, but start taking the Prednisone to help with bronchospasm and the fluid as directed. Continue Nexium and cough syrup. If not resolving, we need Pulmonary input and a CT of your chest.

## 2015-08-13 ENCOUNTER — Telehealth: Payer: Self-pay | Admitting: Family Medicine

## 2015-08-13 NOTE — Telephone Encounter (Signed)
Relation to UX:LKGM Call back number: [phone] Pharmacy: [facility] [address] ([address]), [place] - [address] [phone] ([phone] (Fax)         Reason for call:  Patient requesting antibiotics due to [facility] labs finding a UTI. Patient states [facility] faxed over results a hour ago

## 2015-08-13 NOTE — Telephone Encounter (Signed)
Spoke with patient and she stated that the Bariatric clinic found a UTI and wanted her to get it treated by her PCP. I made the patient aware we have not see her in 3 years ad she will need to be seen, she said she has seen someone in the clinic within the last year. I don't have any results. Please advise    KP

## 2015-08-13 NOTE — Telephone Encounter (Signed)
She has been made aware of the below recommendation from [facility] and will have the results faxed to [phone].    KP

## 2015-08-13 NOTE — Telephone Encounter (Signed)
I don't have the results from them-- when did they say they faxed it

## 2016-01-10 ENCOUNTER — Encounter: Payer: Self-pay | Admitting: Internal Medicine

## 2016-02-08 ENCOUNTER — Encounter: Payer: Self-pay | Admitting: Internal Medicine

## 2016-02-14 ENCOUNTER — Ambulatory Visit (AMBULATORY_SURGERY_CENTER): Payer: Self-pay

## 2016-02-14 VITALS — Ht 69.0 in | Wt 251.0 lb

## 2016-02-14 DIAGNOSIS — K2270 Barrett's esophagus without dysplasia: Secondary | ICD-10-CM

## 2016-02-14 NOTE — Progress Notes (Signed)
Per pt, no allergies to soy or egg products.Pt not taking any weight loss meds or using  O2 at home.  Pt came into the office today for her pre-visit prior to her endo/colon on 02/23/16.Pt states she was only having an endoscopy for hx of Barretts .The pt states she had her colon back in 2014,but we were unable to find records.The pt will try to get the colon report before her next appointment.Per pt's request,the colon was cx'd on 02/23/16 and she will only have the endoscopy at this time.

## 2016-02-23 ENCOUNTER — Encounter: Payer: Self-pay | Admitting: Internal Medicine

## 2016-02-23 ENCOUNTER — Ambulatory Visit (AMBULATORY_SURGERY_CENTER): Payer: Managed Care, Other (non HMO) | Admitting: Internal Medicine

## 2016-02-23 VITALS — BP 119/64 | HR 84 | Temp 98.7°F | Resp 19 | Ht 69.0 in | Wt 251.0 lb

## 2016-02-23 DIAGNOSIS — K2270 Barrett's esophagus without dysplasia: Secondary | ICD-10-CM

## 2016-02-23 MED ORDER — SODIUM CHLORIDE 0.9 % IV SOLN: 500.0000 mL | INTRAVENOUS | Status: DC

## 2016-02-23 NOTE — Patient Instructions (Addendum)
   I took biopsies of the Barrett's - nothing suspicious. If biopsies confirm then check again in 5 years.  I appreciate the opportunity to care for you. Duha Abair E. Jamilya Sarrazin, MIva Boop, FACG YOU HAD AN ENDOSCOPIC PROCEDURE TODAY AT THE [facility]:   Refer to the procedure report that was given to you for any specific questions about what was found during the examination.  If the procedure report does not answer your questions, please call your gastroenterologist to clarify.  If you requested that your care partner not be given the details of your procedure findings, then the procedure report has been included in a sealed envelope for you to review at your convenience later..  Please Note:  You might notice some irritation and congestion in your nose or some drainage.  This is from the oxygen used during your procedure.  There is no need for concern and it should clear up in a day or so.  SYMPTOMS TO REPORT IMMEDIATELY:    Following upper endoscopy (EGD)  Vomiting of blood or coffee ground material  New chest pain or pain under the shoulder blades  Painful or persistently difficult swallowing  New shortness of breath  Fever of 100F or higher  Black, tarry-looking stools  For urgent or emergent issues, a gastroenterologist can be reached at any hour by calling [phone].   DIET: Your first meal following the procedure should be a small meal and then it is ok to progress to your normal diet. Heavy or fried foods are harder to digest and may make you feel nauseous or bloated.  Likewise, meals heavy in dairy and vegetables can increase bloating.  Drink plenty of fluids but you should avoid alcoholic beverages for 24 hours.  ACTIVITY:  You should plan to take it easy for the rest of today and you should NOT DRIVE or use heavy machinery until tomorrow (because of the sedation medicines used during the test).    FOLLOW UP: Our staff will call the number listed on your records the  next business day following your procedure to check on you and address any questions or concerns that you may have regarding the information given to you following your procedure. If we do not reach you, we will leave a message.  However, if you are feeling well and you are not experiencing any problems, there is no need to return our call.  We will assume that you have returned to your regular daily activities without incident.  If any biopsies were taken you will be contacted by phone or by letter within the next 1-3 weeks.  Please call us at [phone] if you have not heard about the biopsies in 3 weeks.    SIGNATURES/CONFIDENTIALITY: You and/or your care partner have signed paperwork which will be entered into your electronic medical record.  These signatures attest to the fact that that the information above on your After Visit Summary has been reviewed and is understood.  Full responsibility of the confidentiality of this discharge information lies with you and/or your care-partner.  Continue current medication Await pathology results

## 2016-02-23 NOTE — Progress Notes (Signed)
Report to PACU, RN, vss, BBS= Clear.  

## 2016-02-23 NOTE — Progress Notes (Signed)
Called to room to assist during endoscopic procedure.  Patient ID and intended procedure confirmed with present staff. Received instructions for my participation in the procedure from the performing physician.  

## 2016-02-23 NOTE — Op Note (Signed)
[facility] Patient Name: Amber Hebert Procedure Date: 02/23/2016 3:07 PM MRN: [id number] Endoscopist: Iva Booparl E Keah Lamba , MD Age: 55 Referring MD:  Date of Birth: 02/28/1961 Gender: Female Procedure:                Upper GI endoscopy Indications:              Surveillance for malignancy due to personal history                            of Barrett's esophagus Medicines:                Propofol total dose 300 mg IV, Monitored Anesthesia                            Care Procedure:                Pre-Anesthesia Assessment:                           - Prior to the procedure, a History and Physical                            was performed, and patient medications and                            allergies were reviewed. The patient's tolerance of                            previous anesthesia was also reviewed. The risks                            and benefits of the procedure and the sedation                            options and risks were discussed with the patient.                            All questions were answered, and informed consent                            was obtained. Prior Anticoagulants: The patient has                            taken no previous anticoagulant or antiplatelet                            agents. ASA Grade Assessment: III - A patient with                            severe systemic disease. After reviewing the risks                            and benefits, the patient was deemed in  satisfactory condition to undergo the procedure.                           After obtaining informed consent, the endoscope was                            passed under direct vision. Throughout the                            procedure, the patient's blood pressure, pulse, and                            oxygen saturations were monitored continuously. The                            Model GIF-[id number]) scope was introduced               through the mouth, and advanced to the prepyloric                            region, stomach. The upper GI endoscopy was                            accomplished without difficulty. The patient                            tolerated the procedure well. Scope In: Scope Out: Findings:      There were esophageal mucosal changes secondary to established       short-segment Barrett's disease present in the distal esophagus. The       maximum longitudinal extent of these mucosal changes was 2 cm in length.       Mucosa was biopsied with a cold forceps for histology in 4 quadrants at       intervals of 2 cm from 28 to 30 cm from the incisors. A total of 2       specimen bottles were sent to pathology. Estimated blood loss: none.      A 5 cm hiatal hernia was found. The proximal extent of the gastric folds       (end of tubular esophagus) was 30 to 35 cm from the incisors. The Z-line       was 30 cm from the incisors.      The exam was otherwise without abnormality.      The cardia and gastric fundus were normal on retroflexion. Complications:            No immediate complications. Estimated Blood Loss:     Estimated blood loss: none. Impression:               - Esophageal mucosal changes secondary to                            established short-segment Barrett's disease.                            Biopsied.                           -  5 cm hiatal hernia.                           - The examination was otherwise normal. Recommendation:           - Patient has a contact number available for                            emergencies. The signs and symptoms of potential                            delayed complications were discussed with the                            patient. Return to normal activities tomorrow.                            Written discharge instructions were provided to the                            patient.                           - Resume previous diet.                            - Continue present medications.                           - Await pathology results.                           - Repeat upper endoscopy for surveillance based on                            pathology results. Procedure Code(s):        --- Professional ---                           [phone], Esophagoscopy, flexible, transoral; with                            biopsy, single or multiple CPT copyright 2016 [employer] Medical Association. All rights reserved. Iva Boop, MD Iva Boop, MD 02/23/2016 3:30:55 PM Number of Addenda: 0

## 2016-02-24 ENCOUNTER — Telehealth: Payer: Self-pay | Admitting: Emergency Medicine

## 2016-02-24 NOTE — Telephone Encounter (Signed)
  Follow up Call-  Call back number 02/23/2016  Post procedure Call Back phone  # [phone]  Permission to leave phone message Yes     Patient questions:  Do you have a fever, pain , or abdominal swelling? No. Pain Score  0 *  Have you tolerated food without any problems? Yes.    Have you been able to return to your normal activities? Yes.    Do you have any questions about your discharge instructions: Diet   No. Medications  No. Follow up visit  No.  Do you have questions or concerns about your Care? No.  Actions: * If pain score is 4 or above: No action needed, pain <4.

## 2016-02-29 ENCOUNTER — Encounter: Payer: Self-pay | Admitting: Internal Medicine

## 2016-02-29 NOTE — Progress Notes (Signed)
Quick Note:  Barrett;s no dysplasia - recall 2020 3 yrs - has had 2 bxs 3 yrs apart no dysplasia ______

## 2016-03-02 ENCOUNTER — Encounter: Payer: Self-pay | Admitting: Medical

## 2016-03-02 ENCOUNTER — Ambulatory Visit (HOSPITAL_BASED_OUTPATIENT_CLINIC_OR_DEPARTMENT_OTHER)
Admission: RE | Admit: 2016-03-02 | Discharge: 2016-03-02 | Disposition: A | Discharge status: Home / Self Care | Payer: Managed Care, Other (non HMO) | Source: Ambulatory Visit | Attending: Medical | Admitting: Medical

## 2016-03-02 ENCOUNTER — Ambulatory Visit (INDEPENDENT_AMBULATORY_CARE_PROVIDER_SITE_OTHER): Payer: Managed Care, Other (non HMO) | Admitting: Medical

## 2016-03-02 ENCOUNTER — Telehealth: Payer: Self-pay | Admitting: Medical

## 2016-03-02 VITALS — BP 100/60 | HR 60 | Temp 98.4°F | Ht 70.0 in | Wt 251.0 lb

## 2016-03-02 DIAGNOSIS — R05 Cough: Secondary | ICD-10-CM | POA: Insufficient documentation

## 2016-03-02 DIAGNOSIS — R059 Cough, unspecified: Secondary | ICD-10-CM

## 2016-03-02 DIAGNOSIS — J189 Pneumonia, unspecified organism: Secondary | ICD-10-CM

## 2016-03-02 DIAGNOSIS — R918 Other nonspecific abnormal finding of lung field: Secondary | ICD-10-CM | POA: Diagnosis not present

## 2016-03-02 DIAGNOSIS — S92351A Displaced fracture of fifth metatarsal bone, right foot, initial encounter for closed fracture: Secondary | ICD-10-CM | POA: Diagnosis not present

## 2016-03-02 DIAGNOSIS — S8261XA Displaced fracture of lateral malleolus of right fibula, initial encounter for closed fracture: Secondary | ICD-10-CM | POA: Insufficient documentation

## 2016-03-02 DIAGNOSIS — M7731 Calcaneal spur, right foot: Secondary | ICD-10-CM | POA: Diagnosis not present

## 2016-03-02 DIAGNOSIS — M7989 Other specified soft tissue disorders: Secondary | ICD-10-CM | POA: Diagnosis not present

## 2016-03-02 DIAGNOSIS — X501XXA Overexertion from prolonged static or awkward postures, initial encounter: Secondary | ICD-10-CM | POA: Insufficient documentation

## 2016-03-02 DIAGNOSIS — J209 Acute bronchitis, unspecified: Secondary | ICD-10-CM

## 2016-03-02 DIAGNOSIS — M25471 Effusion, right ankle: Secondary | ICD-10-CM | POA: Diagnosis not present

## 2016-03-02 DIAGNOSIS — S82891A Other fracture of right lower leg, initial encounter for closed fracture: Secondary | ICD-10-CM

## 2016-03-02 DIAGNOSIS — L989 Disorder of the skin and subcutaneous tissue, unspecified: Secondary | ICD-10-CM

## 2016-03-02 DIAGNOSIS — M25571 Pain in right ankle and joints of right foot: Secondary | ICD-10-CM

## 2016-03-02 DIAGNOSIS — S92901A Unspecified fracture of right foot, initial encounter for closed fracture: Secondary | ICD-10-CM

## 2016-03-02 MED ORDER — AZITHROMYCIN 250 MG PO TABS: ORAL_TABLET | ORAL | Status: DC

## 2016-03-02 MED ORDER — BENZONATATE 100 MG PO CAPS: 100.0000 mg | ORAL_CAPSULE | Freq: Three times a day (TID) | ORAL | Status: DC | PRN

## 2016-03-02 MED ORDER — ALBUTEROL SULFATE HFA 108 (90 BASE) MCG/ACT IN AERS: 2.0000 | INHALATION_SPRAY | Freq: Four times a day (QID) | RESPIRATORY_TRACT | Status: DC | PRN

## 2016-03-02 NOTE — Telephone Encounter (Signed)
Also distracted fracture of 5th metatarsal rt foot. I did not see this foot report when I placed the ankle fx referral. So please get in with ortho tomorrow. Send them both reports. Thanks.

## 2016-03-02 NOTE — Telephone Encounter (Signed)
Refer to ortho.

## 2016-03-02 NOTE — Telephone Encounter (Signed)
Ankle fx as described on referral. Can she ortho 03-03-2016 this Friday. I referred to ortho thinking sports med not option for Friday. Am I right. Let me know if referral successful by 10 am.

## 2016-03-02 NOTE — Telephone Encounter (Signed)
Future cxr order already placed

## 2016-03-02 NOTE — Patient Instructions (Signed)
You have flu like syndrome but can't take tamiflu. You appear to have secondary bacterial type infection symptoms/bronchitis presently Will rx azithromycin antibiotic. Benzonatate for cough.  For any wheezing rx albuterol  If by Monday not feeling improved then get chest xray.  For your ankle/foot pain get xrays. RICE therapy. Ibuprofen for pain.  For skin lesion that persists x 3 months. Refer to dermatologist.  Follow up in 7 days or as needed

## 2016-03-02 NOTE — Progress Notes (Signed)
Pre visit review using our clinic review tool, if applicable. No additional management support is needed unless otherwise documented below in the visit note. 

## 2016-03-02 NOTE — Progress Notes (Signed)
Subjective:    Patient ID: Amber Hebert, female    DOB: 30-Nov-1961, 55 y.o.   MRN: [id number]  HPI   Pt in with 2 days of cough. Cough is productive. Bringing up some yellow mucous. Pt had chills and sweats. 2 days of body aches. Less achiness today. Feels tired today.  Pt had type A flu 4 weeks ago. Pt can't take flu vaccine due to allergy. Also reaction to tamiflu in past.  Pt fiancee has bronchitis. He was sick before her.  Rt anke and foot pain after twisting yesterday. Hurts to walk but can ambulate.  Lt side of nose non healing skin lesion. Scab present now.    Review of Systems  Constitutional: Positive for fever, chills and fatigue.  HENT: Positive for congestion. Negative for drooling, ear pain, hearing loss, postnasal drip and sinus pressure.   Respiratory: Positive for cough. Negative for chest tightness, shortness of breath and wheezing.   Cardiovascular: Negative for chest pain and palpitations.  Gastrointestinal: Negative for abdominal pain.  Musculoskeletal: Positive for myalgias. Negative for back pain.       Rt ankle and foot pain.  Skin: Negative for rash.       Lt side skin lesion.  Neurological: Negative for dizziness and headaches.  Hematological: Negative for adenopathy. Does not bruise/bleed easily.  Psychiatric/Behavioral: Negative for behavioral problems and confusion. The patient is not nervous/anxious.      Past Medical History  Diagnosis Date  . Iron deficiency anemia   . GERD (gastroesophageal reflux disease)   . Incontinence of urine   . Chronic bronchitis (HCC)   . Barrett's esophagus 01/09/2013    Social History   Social History  . Marital Status: Married    Spouse Name: N/A  . Number of Children: 4  . 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 Smoking status: Never Smoker   . Smokeless tobacco: Never Used  . Alcohol Use: No  . Drug Use: No  . Sexual Activity: Not on  file   Other Topics Concern  . 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 External ear surgery      Ear Bone Surgery  . Upper gastrointestinal endoscopy  2014  . Cesarean section      1 time  . Total knee arthroplasty      right  . Colonoscopy  2012    date approx - negative exam per patient    Family History  Problem Relation Age of Onset  . Esophageal cancer Brother   . Breast cancer Maternal Grandmother   . Breast cancer Paternal Grandmother   . Aneurysm Father   . Asthma Mother   . Diabetes Mother     Allergies  Allergen Reactions  . Dexilant [Dexlansoprazole] Anaphylaxis, Hives and Swelling  . Fluzone     REACTION: anaphylaxsis, hives  . Oseltamivir Phosphate   . Penicillins     Unkown  . Sulfonamide Derivatives Rash    "put in hospital per pt!    Current Outpatient Prescriptions on File Prior to Visit  Medication Sig Dispense Refill  . Cholecalciferol (VITAMIN D) 1000 UNITS capsule Take 1,000 Units by mouth daily.    Marland Kitchen esomeprazole (NEXIUM) 40 MG capsule Take 40 mg by mouth  daily at 12 noon.    . Multiple Vitamin (MULTIVITAMIN) capsule Take 1 capsule by mouth daily.     No current facility-administered medications on file prior to visit.    BP 100/60 mmHg  Pulse 60  Temp(Src) 98.4 F (36.9 C) (Oral)  Ht 5\' 10"  (1.778 m)  Wt 251 lb (113.853 kg)  BMI 36.01 kg/m2  SpO2 98%       Objective:   Physical Exam   General  Mental Status - Alert. General Appearance - Well groomed. Not in acute distress.  Skin Rashes- No Rashes. Lt side of nares below the bridge small 6 mm scab.  HEENT Head- Normal. Ear Auditory Canal - Left- Normal. Right - Normal.Tympanic Membrane- Left- Normal. Right- Normal. Eye Sclera/Conjunctiva- Left- Normal. Right- Normal. Nose & Sinuses Nasal Mucosa- Left-  Boggy and Congested. Right-  Boggy and  Congested.Bilateral no maxillary and no frontal sinus pressure. Mouth & Throat Lips: Upper Lip- Normal: no dryness, cracking, pallor, cyanosis, or vesicular eruption. Lower Lip-Normal: no dryness, cracking, pallor, cyanosis or vesicular eruption. Buccal Mucosa- Bilateral- No Aphthous ulcers. Oropharynx- No Discharge or Erythema. Tonsils: Characteristics- Bilateral- No Erythema or Congestion. Size/Enlargement- Bilateral- No enlargement. Discharge- bilateral-None.  Neck Neck- Supple. No Masses.   Chest and Lung Exam Auscultation: Breath Sounds:- even and unlabored. Faint lower lobe rhonchi. When breaths deep will cough.  Cardiovascular Auscultation:Rythm- Regular, rate and rhythm. Murmurs & Other Heart Sounds:Ausculatation of the heart reveal- No Murmurs.  Lymphatic Head & Neck General Head & Neck Lymphatics: Bilateral: Description- No Localized lymphadenopathy.  Rt ankle- swollen and tender lateral malleoulus.  Rt foot proximal foot tender as well.    Assessment & Plan:  You have flu like syndrome but can't take tamiflu. You appear to have secondary bacterial type infection symptoms/bronchitis presently Will rx azithromycin antibiotic. Benzonatate for cough.  For any wheezing rx albuterol  If by Monday not feeling improved then get chest xray.  For your ankle/foot pain get xrays. RICE therapy. Ibuprofen for pain.  For skin lesion that persists x 3 months. Refer to dermatologist.  Follow up in 7 days or as

## 2016-03-03 NOTE — Telephone Encounter (Signed)
I called and gave pt message on her fx and her cxr. Explained what I had asked MA to explain on the note.

## 2016-03-10 ENCOUNTER — Encounter: Payer: Self-pay | Admitting: Family Medicine

## 2016-03-10 ENCOUNTER — Ambulatory Visit (INDEPENDENT_AMBULATORY_CARE_PROVIDER_SITE_OTHER): Payer: Managed Care, Other (non HMO) | Admitting: Family Medicine

## 2016-03-10 VITALS — BP 118/80 | HR 85 | Temp 98.3°F | Wt 239.0 lb

## 2016-03-10 DIAGNOSIS — R059 Cough, unspecified: Secondary | ICD-10-CM

## 2016-03-10 DIAGNOSIS — R05 Cough: Secondary | ICD-10-CM

## 2016-03-10 DIAGNOSIS — J189 Pneumonia, unspecified organism: Secondary | ICD-10-CM | POA: Diagnosis not present

## 2016-03-10 MED ORDER — LEVOFLOXACIN 500 MG PO TABS: 500.0000 mg | ORAL_TABLET | Freq: Every day | ORAL | Status: DC

## 2016-03-10 MED ORDER — PREDNISONE 10 MG PO TABS: ORAL_TABLET | ORAL | Status: DC

## 2016-03-10 MED ORDER — BENZONATATE 100 MG PO CAPS: 100.0000 mg | ORAL_CAPSULE | Freq: Three times a day (TID) | ORAL | Status: DC | PRN

## 2016-03-10 MED ORDER — GUAIFENESIN-CODEINE 100-10 MG/5ML PO SYRP: 5.0000 mL | ORAL_SOLUTION | Freq: Three times a day (TID) | ORAL | Status: AC | PRN

## 2016-03-10 NOTE — Patient Instructions (Addendum)

## 2016-03-10 NOTE — Progress Notes (Signed)
Pre visit review using our clinic review tool, if applicable. No additional management support is needed unless otherwise documented below in the visit note. 

## 2016-03-10 NOTE — Progress Notes (Signed)
Subjective:    Patient ID: Amber Hebert, female    DOB: 03/20/1961, 55 y.o.   MRN: [id number]  Chief Complaint  Patient presents with  . Pneumonia    1 week follow up. still coughing and has no energy    HPI Patient is in today for Pneumonia---she was dx 1 week ago ---see last ov.   She is still coughing.    Past Medical History  Diagnosis Date  . Iron deficiency anemia   . GERD (gastroesophageal reflux disease)   . Incontinence of urine   . Chronic bronchitis (HCC)   . Barrett's esophagus 01/09/2013    Past Surgical History  Procedure Laterality Date  . External ear surgery      Ear Bone Surgery  . Upper gastrointestinal endoscopy  2014  . Cesarean section      1 time  . Total knee arthroplasty      right  . Colonoscopy  2012    date approx - negative exam per patient    Family History  Problem Relation Age of Onset  . Esophageal cancer Brother   . Breast cancer Maternal Grandmother   . Breast cancer Paternal Grandmother   . Aneurysm Father   . Asthma Mother   . Diabetes Mother     Social History   Social History  . Marital Status: Married    Spouse Name: N/A  . Number of Children: 4  . 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 Smoking status: Never Smoker   . Smokeless tobacco: Never Used  . Alcohol Use: No  . Drug Use: No  . Sexual Activity: Not on file   Other Topics Concern  . 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 albuterol (PROVENTIL HFA;VENTOLIN HFA) 108 (90 Base) MCG/ACT inhaler Inhale 2 puffs into the lungs every 6 (six) hours as needed for wheezing or shortness of breath. 1 Inhaler 0  . Cholecalciferol (VITAMIN D) 1000 UNITS capsule Take 1,000 Units by mouth daily.    Marland Kitchen. esomeprazole (NEXIUM) 40 MG capsule Take 40 mg by mouth  daily at 12 noon.    . Multiple Vitamin (MULTIVITAMIN) capsule Take 1 capsule by mouth daily.    Marland Kitchen. azithromycin (ZITHROMAX) 250 MG tablet Take 2 tablets by mouth on day 1, followed by 1 tablet by mouth daily for 4 days. 6 tablet 0  . benzonatate (TESSALON) 100 MG capsule Take 1 capsule (100 mg total) by mouth 3 (three) times daily as needed. 21 capsule 0   No facility-administered medications prior to visit.    Allergies  Allergen Reactions  . Dexilant [Dexlansoprazole] Anaphylaxis, Hives and Swelling  . Fluzone     REACTION: anaphylaxsis, hives  . Oseltamivir Phosphate   . Penicillins     Unkown  . Sulfonamide Derivatives Rash    "put in hospital per pt!    Review of Systems  Constitutional: Negative for fever, chills and malaise/fatigue.  HENT: Negative for congestion and hearing loss.   Eyes: Negative for discharge.  Respiratory: Positive for cough and sputum production. Negative for shortness of breath.   Cardiovascular: Negative for chest pain, palpitations and leg swelling.  Gastrointestinal: Negative for heartburn, nausea, vomiting, abdominal pain, diarrhea, constipation and blood in  stool.  Genitourinary: Negative for dysuria, urgency, frequency and hematuria.  Musculoskeletal: Negative for myalgias, back pain and falls.  Skin: Negative for rash.  Neurological: Negative for dizziness, sensory change, loss of consciousness, weakness and headaches.  Endo/Heme/Allergies: Negative for environmental allergies. Does not bruise/bleed easily.  Psychiatric/Behavioral: Negative for depression and suicidal ideas. The patient is not nervous/anxious and does not have insomnia.        Objective:    Physical Exam  Constitutional: She is oriented to person, place, and time. She appears well-developed and well-nourished.  HENT:  Head: Normocephalic and atraumatic.  Eyes: Conjunctivae and EOM are normal.  Neck: Normal range of motion. Neck supple. No JVD present. Carotid bruit is not  present. No thyromegaly present.  Cardiovascular: Normal rate, regular rhythm and normal heart sounds.   No murmur heard. Pulmonary/Chest: Effort normal. No respiratory distress. She has decreased breath sounds. She has no wheezes. She has no rales. She exhibits no tenderness.  Musculoskeletal: She exhibits no edema.  Neurological: She is alert and oriented to person, place, and time.  Psychiatric: She has a normal mood and affect. Her behavior is normal. Judgment and thought content normal.  Nursing note and vitals reviewed.   BP 118/80 mmHg  Pulse 85  Temp(Src) 98.3 F (36.8 C) (Oral)  Wt 239 lb (108.41 kg)  SpO2 97% Wt Readings from Last 3 Encounters:  03/10/16 239 lb (108.41 kg)  03/02/16 251 lb (113.853 kg)  02/23/16 251 lb (113.853 kg)     Lab Results  Component Value Date   WBC 6.4 03/24/2015   HGB 13.8 03/24/2015   HCT 41.0 03/24/2015   PLT 342.0 03/24/2015   GLUCOSE 93 11/25/2012   ALT 26 11/25/2012   AST 21 11/25/2012   NA 136 11/25/2012   K 4.0 11/25/2012   CL 103 11/25/2012   CREATININE 0.8 11/25/2012   BUN 16 11/25/2012   CO2 25 11/25/2012   TSH 1.21 11/25/2012   INR 1.85* 02/24/2011   HGBA1C * 02/16/2011    5.9 (NOTE)                                                                       According to the ADA Clinical Practice Recommendations for 2011, when HbA1c is used as a screening test:   >=6.5%   Diagnostic of Diabetes Mellitus           (if abnormal result  is confirmed)  5.7-6.4%   Increased risk of developing Diabetes Mellitus  References:Diagnosis and Classification of Diabetes Mellitus,Diabetes Care,2011,34(Suppl 1):S62-S69 and Standards of Medical Care in         Diabetes - 2011,Diabetes Care,2011,34  (Suppl 1):S11-S61.    Lab Results  Component Value Date   TSH 1.21 11/25/2012   Lab Results  Component Value Date   WBC 6.4 03/24/2015   HGB 13.8 03/24/2015   HCT 41.0 03/24/2015   MCV 88.7 03/24/2015   PLT 342.0 03/24/2015   Lab Results   Component Value Date   NA 136 11/25/2012   K 4.0 11/25/2012   CO2 25 11/25/2012   GLUCOSE 93 11/25/2012   BUN 16 11/25/2012   CREATININE 0.8 11/25/2012   BILITOT 0.6 11/25/2012   ALKPHOS 82 11/25/2012  AST 21 11/25/2012   ALT 26 11/25/2012   PROT 7.4 11/25/2012   ALBUMIN 4.2 11/25/2012   CALCIUM 9.2 11/25/2012   GFR 80.96 11/25/2012   No results found for: CHOL No results found for: HDL No results found for: LDLCALC No results found for: TRIG No results found for: CHOLHDL Lab Results  Component Value Date   HGBA1C * 02/16/2011    5.9 (NOTE)                                                                       According to the ADA Clinical Practice Recommendations for 2011, when HbA1c is used as a screening test:   >=6.5%   Diagnostic of Diabetes Mellitus           (if abnormal result  is confirmed)  5.7-6.4%   Increased risk of developing Diabetes Mellitus  References:Diagnosis and Classification of Diabetes Mellitus,Diabetes Care,2011,34(Suppl 1):S62-S69 and Standards of Medical Care in         Diabetes - 2011,Diabetes Care,2011,34  (Suppl 1):S11-S61.       Assessment & Plan:   Problem List Items Addressed This Visit      Unprioritized   CAP (community acquired pneumonia)   Relevant Medications   benzonatate (TESSALON) 100 MG capsule   guaiFENesin-codeine (ROBITUSSIN AC) 100-10 MG/5ML syrup   levofloxacin (LEVAQUIN) 500 MG tablet    Other Visit Diagnoses    Cough    -  Primary    Relevant Medications    predniSONE (DELTASONE) 10 MG tablet       I have discontinued Ms. Sherr's azithromycin. I am also having her start on guaiFENesin-codeine, levofloxacin, and predniSONE. Additionally, I am having her maintain her multivitamin, Vitamin D, esomeprazole, albuterol, Vitamin D (Ergocalciferol), and benzonatate.  Meds ordered this encounter  Medications  . Vitamin D, Ergocalciferol, (DRISDOL) 50000 units CAPS capsule    Sig:   . benzonatate (TESSALON) 100 MG  capsule    Sig: Take 1 capsule (100 mg total) by mouth 3 (three) times daily as needed.    Dispense:  21 capsule    Refill:  0  . guaiFENesin-codeine (ROBITUSSIN AC) 100-10 MG/5ML syrup    Sig: Take 5 mLs by mouth 3 (three) times daily as needed for cough.    Dispense:  120 mL    Refill:  0  . levofloxacin (LEVAQUIN) 500 MG tablet    Sig: Take 1 tablet (500 mg total) by mouth daily. X 10 days    Dispense:  10 tablet    Refill:  0  . predniSONE (DELTASONE) 10 MG tablet    Sig: 3 po qd for 3 days then 2 po qd for 3 days the 1 po qd for 3 days    Dispense:  18 tablet    Refill:  0     Donato Schultz, DO

## 2016-03-11 DIAGNOSIS — J189 Pneumonia, unspecified organism: Secondary | ICD-10-CM | POA: Insufficient documentation

## 2016-03-21 ENCOUNTER — Telehealth: Payer: Self-pay | Admitting: Family Medicine

## 2016-03-21 ENCOUNTER — Ambulatory Visit: Payer: Managed Care, Other (non HMO) | Admitting: Family Medicine

## 2016-03-22 NOTE — Telephone Encounter (Signed)
Pt was no show 03/21/16 6:15pm for follow up appt, pt has not rescheduled, 1st no show w/in 12 months, charge or no charge?

## 2016-03-23 ENCOUNTER — Encounter: Payer: Self-pay | Admitting: Medical

## 2016-03-23 ENCOUNTER — Ambulatory Visit (HOSPITAL_BASED_OUTPATIENT_CLINIC_OR_DEPARTMENT_OTHER)
Admission: RE | Admit: 2016-03-23 | Discharge: 2016-03-23 | Disposition: A | Discharge status: Home / Self Care | Payer: Managed Care, Other (non HMO) | Source: Ambulatory Visit | Attending: Medical | Admitting: Medical

## 2016-03-23 ENCOUNTER — Ambulatory Visit (INDEPENDENT_AMBULATORY_CARE_PROVIDER_SITE_OTHER): Payer: Managed Care, Other (non HMO) | Admitting: Medical

## 2016-03-23 VITALS — BP 116/78 | HR 91 | Ht 70.0 in | Wt 250.0 lb

## 2016-03-23 DIAGNOSIS — J189 Pneumonia, unspecified organism: Secondary | ICD-10-CM

## 2016-03-23 NOTE — Progress Notes (Signed)
Subjective:    Patient ID: Amber Hebert, female    DOB: 1961-06-12, 55 y.o.   MRN: [id number]  HPI  Pt in for follow up. She feels a lot better. Pt started on zpack when I saw her for early pneumonia in march. Then symptom persisted with  Coughing, sob,  and feeling run down when she saw Dr. Laury Axon about a wk later. She was given levaquin and some prednisone. Pt has finished both meds. Now about 90% better. Now only rare occasional cough.   Review of Systems  Constitutional: Negative for fever, chills and fatigue.  HENT: Negative for congestion, ear pain, sinus pressure, sneezing and sore throat.   Respiratory: Positive for cough. Negative for choking, shortness of breath and wheezing.   Cardiovascular: Negative for chest pain and palpitations.  Musculoskeletal: Negative for back pain.  Skin: Negative for rash.  Neurological: Negative for dizziness and headaches.  Hematological: Negative for adenopathy. Does not bruise/bleed easily.  Psychiatric/Behavioral: Negative for behavioral problems and confusion.    Past Medical History  Diagnosis Date  . Iron deficiency anemia   . GERD (gastroesophageal reflux disease)   . Incontinence of urine   . Chronic bronchitis (HCC)   . Barrett's esophagus 01/09/2013     Social History   Social History  . Marital Status: Married    Spouse Name: N/A  . Number of Children: 4  . 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 Smoking status: Never Smoker   . Smokeless tobacco: Never Used  . Alcohol Use: No  . Drug Use: No  . Sexual Activity: Not on file   Other Topics Concern  . 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 External ear surgery      Ear Bone Surgery  . Upper gastrointestinal endoscopy  2014  . Cesarean section      1 time  . Total knee  arthroplasty      right  . Colonoscopy  2012    date approx - negative exam per patient    Family History  Problem Relation Age of Onset  . Esophageal cancer Brother   . Breast cancer Maternal Grandmother   . Breast cancer Paternal Grandmother   . Aneurysm Father   . Asthma Mother   . Diabetes Mother     Allergies  Allergen Reactions  . Dexilant [Dexlansoprazole] Anaphylaxis, Hives and Swelling  . Fluzone     REACTION: anaphylaxsis, hives  . Oseltamivir Phosphate   . Penicillins     Unkown  . Sulfonamide Derivatives Rash    "put in hospital per pt!    Current Outpatient Prescriptions on File Prior to Visit  Medication Sig Dispense Refill  . albuterol (PROVENTIL HFA;VENTOLIN HFA) 108 (90 Base) MCG/ACT inhaler Inhale 2 puffs into the lungs every 6 (six) hours as needed for wheezing or shortness of breath. 1 Inhaler 0  . Cholecalciferol (VITAMIN D) 1000 UNITS capsule Take 1,000 Units by mouth daily.    Marland Kitchen esomeprazole (NEXIUM) 40 MG capsule Take 40 mg by mouth daily at 12 noon.    Marland Kitchen guaiFENesin-codeine (ROBITUSSIN AC) 100-10 MG/5ML syrup Take 5 mLs by mouth 3 (three) times daily as needed for cough. 120 mL 0  . Multiple Vitamin (MULTIVITAMIN) capsule Take  1 capsule by mouth daily.    . Vitamin D, Ergocalciferol, (DRISDOL) 50000 units CAPS capsule      No current facility-administered medications on file prior to visit.    BP 116/78 mmHg  Pulse 91  Ht 5\' 10"  (1.778 m)  Wt 250 lb (113.399 kg)  BMI 35.87 kg/m2  SpO2 98%       Objective:   Physical Exam   General  Mental Status - Alert. General Appearance - Well groomed. Not in acute distress.  Skin Rashes- No Rashes.  HEENT Head- Normal. Ear Auditory Canal - Left- Normal. Right - Normal.Tympanic Membrane- Left- Normal. Right- Normal. Eye Sclera/Conjunctiva- Left- Normal. Right- Normal. Nose & Sinuses Nasal Mucosa- Left- not Boggy or  Congested. Right-  Not boggy or  Congested.Bilateral no  maxillary and no   frontal sinus pressure. Mouth & Throat Lips: Upper Lip- Normal: no dryness, cracking, pallor, cyanosis, or vesicular eruption. Lower Lip-Normal: no dryness, cracking, pallor, cyanosis or vesicular eruption. Buccal Mucosa- Bilateral- No Aphthous ulcers. Oropharynx- No Discharge or Erythema. +pnd Tonsils: Characteristics- Bilateral- No Erythema or Congestion. Size/Enlargement- Bilateral- No enlargement. Discharge- bilateral-None.  Neck Neck- Supple. No Masses.   Chest and Lung Exam Auscultation: Breath Sounds:-Clear even and unlabored.  Cardiovascular Auscultation:Rythm- Regular, rate and rhythm. Murmurs & Other Heart Sounds:Ausculatation of the heart reveal- No Murmurs.  Lymphatic Head & Neck General Head & Neck Lymphatics: Bilateral: Description- No Localized lymphadenopathy.      Assessment & Plan:  You do appear to better by exam and recent history.   Will go ahead and get cxr today. Confirm resolution of pneumonia.  Continue benzonatate for cough. If pneumonia cleared by cxr and cough persists let us know.  Follow up as needed or as regularly scheduled. We will update you cxr.

## 2016-03-23 NOTE — Patient Instructions (Addendum)
You do appear to better by exam and recent history.   Will go ahead and get cxr today. Confirm resolution of pneumonia.  Continue benzonatate for cough. If pneumonia cleared by cxr and cough persists let us know.  Follow up as needed or as regularly scheduled. We will update you cxr.

## 2016-03-23 NOTE — Telephone Encounter (Signed)
Call pt please

## 2016-03-23 NOTE — Telephone Encounter (Signed)
No charge. 

## 2016-03-23 NOTE — Telephone Encounter (Signed)
Pt states that overall she is feeling better. She does still have a cough. She states she was to have a repeat chest xray and possible biopsy. Scheduled pt for this evening 5:30pm with Ramon DredgeEdward. Please let me know if this needs changed.

## 2016-03-23 NOTE — Progress Notes (Signed)
Pre visit review using our clinic review tool, if applicable. No additional management support is needed unless otherwise documented below in the visit note. 

## 2016-04-11 ENCOUNTER — Encounter: Payer: Self-pay | Admitting: Internal Medicine

## 2017-03-14 ENCOUNTER — Other Ambulatory Visit: Payer: Self-pay

## 2017-04-04 ENCOUNTER — Ambulatory Visit (HOSPITAL_BASED_OUTPATIENT_CLINIC_OR_DEPARTMENT_OTHER): Payer: BLUE CROSS/BLUE SHIELD

## 2017-04-04 ENCOUNTER — Other Ambulatory Visit (HOSPITAL_BASED_OUTPATIENT_CLINIC_OR_DEPARTMENT_OTHER): Payer: BLUE CROSS/BLUE SHIELD

## 2017-04-04 ENCOUNTER — Other Ambulatory Visit: Payer: Self-pay | Admitting: Family

## 2017-04-04 ENCOUNTER — Ambulatory Visit (HOSPITAL_BASED_OUTPATIENT_CLINIC_OR_DEPARTMENT_OTHER): Payer: BLUE CROSS/BLUE SHIELD | Admitting: Family

## 2017-04-04 VITALS — BP 126/69 | HR 70 | Temp 98.1°F | Resp 17 | Wt 258.0 lb

## 2017-04-04 DIAGNOSIS — Z862 Personal history of diseases of the blood and blood-forming organs and certain disorders involving the immune mechanism: Secondary | ICD-10-CM | POA: Diagnosis not present

## 2017-04-04 DIAGNOSIS — L989 Disorder of the skin and subcutaneous tissue, unspecified: Secondary | ICD-10-CM | POA: Diagnosis not present

## 2017-04-04 DIAGNOSIS — E039 Hypothyroidism, unspecified: Secondary | ICD-10-CM | POA: Diagnosis not present

## 2017-04-04 DIAGNOSIS — D508 Other iron deficiency anemias: Secondary | ICD-10-CM

## 2017-04-04 DIAGNOSIS — D509 Iron deficiency anemia, unspecified: Secondary | ICD-10-CM | POA: Diagnosis not present

## 2017-04-04 LAB — CBC WITH DIFFERENTIAL (CANCER CENTER ONLY)
BASO#: 0 10*3/uL (ref 0.0–0.2)
BASO%: 0.5 % (ref 0.0–2.0)
EOS ABS: 0.5 10*3/uL (ref 0.0–0.5)
EOS%: 5.9 % (ref 0.0–7.0)
HEMATOCRIT: 41.8 % (ref 34.8–46.6)
HEMOGLOBIN: 13.7 g/dL (ref 11.6–15.9)
LYMPH#: 2.7 10*3/uL (ref 0.9–3.3)
LYMPH%: 34.9 % (ref 14.0–48.0)
MCH: 30.6 pg (ref 26.0–34.0)
MCHC: 32.8 g/dL (ref 32.0–36.0)
MCV: 94 fL (ref 81–101)
MONO#: 0.6 10*3/uL (ref 0.1–0.9)
MONO%: 7.2 % (ref 0.0–13.0)
NEUT%: 51.5 % (ref 39.6–80.0)
NEUTROS ABS: 3.9 10*3/uL (ref 1.5–6.5)
Platelets: 285 10*3/uL (ref 145–400)
RBC: 4.47 10*6/uL (ref 3.70–5.32)
RDW: 12.5 % (ref 11.1–15.7)
WBC: 7.6 10*3/uL (ref 3.9–10.0)

## 2017-04-04 LAB — CHCC SATELLITE - SMEAR

## 2017-04-04 NOTE — Progress Notes (Signed)
Hematology/Oncology Consultation   Name: MARISHA RENIER      MRN: [id number]    Location: Room/bed info not found  Date: 04/04/2017 Time:10:21 PM   REFERRING PHYSICIAN: Self  REASON FOR CONSULT: Iron deficiency anemia    DIAGNOSIS: Iron deficiency anemia  HISTORY OF PRESENT ILLNESS: Ms. Loja is a very pleasant 56 yo caucasian female with history of iron deficiency. She has been a patient of ours in the past. She was last seen by Dr. Myna Hidalgo in December 2013. It has been over 5 years since she last received IV iron.  She is here today wanting to re-establish care. She has been symptomatic with fatigue lately and is concerned that her iron studies have dropped. Hgb is 13.7 with an MCV of 94 and platelet count is 285. Iron studies are pending.  She no longer has a cycle. She denies any episodes of bleeding, bruising or petechiae.  No family history of anemia that she know of.  She has a lesion, 0.5 cm x 0.5 cm, on the left side of her nose that won't heal, bleeds on occasion and is concerning for skin cancer. She has had this for almost a year. She has one day left of Keflex for facial cellulitis of the left side of her face which appears to have resolved.  No fever, chills, n/v, cough, rash, dizziness, SOB, chest pain, palpitations, abdominal pain or changes in bowel or bladder habits.  No lymphadenopathy found on exam.  No swelling, tenderness, numbness or tingling in her extremities. No c/o pain at this time.  She has maintained a good appetite and is staying hydrated. Her weight is stable.  She is not a smoker. She does enjoy 1-2 glasses of wine once or twice a week.  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  She has gotten engaged since we last saw her. Unfortunately her fiance's sister has stage 4 cancer. They are planning a trip to [place] next month for her to visit family. This will be a bittersweet trip for all of them.   ROS: All other 10 point review  of systems is negative.   PAST MEDICAL HISTORY:   Past Medical History:  Diagnosis Date  . Barrett's esophagus 01/09/2013  . Chronic bronchitis (HCC)   . GERD (gastroesophageal reflux disease)   . Incontinence of urine   . Iron deficiency anemia     ALLERGIES: Allergies  Allergen Reactions  . Dexilant [Dexlansoprazole] Anaphylaxis, Hives and Swelling  . Fluzone     REACTION: anaphylaxsis, hives  . Oseltamivir Phosphate   . Penicillins     Unkown  . Sulfonamide Derivatives Rash    "put in hospital per pt!      MEDICATIONS:  Current Outpatient Prescriptions on File Prior to Visit  Medication Sig Dispense Refill  . albuterol (PROVENTIL HFA;VENTOLIN HFA) 108 (90 Base) MCG/ACT inhaler Inhale 2 puffs into the lungs every 6 (six) hours as needed for wheezing or shortness of breath. 1 Inhaler 0  . Cholecalciferol (VITAMIN D) 1000 UNITS capsule Take 1,000 Units by mouth daily.    Marland Kitchen esomeprazole (NEXIUM) 40 MG capsule Take 40 mg by mouth daily at 12 noon.    Marland Kitchen guaiFENesin-codeine (ROBITUSSIN AC) 100-10 MG/5ML syrup Take 5 mLs by mouth 3 (three) times daily as needed for cough. 120 mL 0  . Multiple Vitamin (MULTIVITAMIN) capsule Take 1 capsule by mouth daily.    . Vitamin D, Ergocalciferol, (DRISDOL) 50000 units CAPS capsule  No current facility-administered medications on file prior to visit.      PAST SURGICAL HISTORY Past Surgical History:  Procedure Laterality Date  . CESAREAN SECTION     1 time  . COLONOSCOPY  2012   date approx - negative exam per patient  . EXTERNAL EAR SURGERY     Ear Bone Surgery  . TOTAL KNEE ARTHROPLASTY     right  . UPPER GASTROINTESTINAL ENDOSCOPY  2014    FAMILY HISTORY: Family History  Problem Relation Age of Onset  . Esophageal cancer Brother   . Breast cancer Maternal Grandmother   . Breast cancer Paternal Grandmother   . Aneurysm Father   . Asthma Mother   . Diabetes Mother     SOCIAL HISTORY:  reports that she has never  smoked. She has never used smokeless tobacco. She reports that she does not drink alcohol or use drugs.  PERFORMANCE STATUS: The patient's performance status is 1 - Symptomatic but completely ambulatory  PHYSICAL EXAM: Most Recent Vital Signs: Blood pressure 126/69, pulse 70, temperature 98.1 F (36.7 C), temperature source Oral, resp. rate 17, weight 258 lb (117 kg), SpO2 100 %. BP 126/69 (BP Location: Left Arm, Patient Position: Sitting)   Pulse 70   Temp 98.1 F (36.7 C) (Oral)   Resp 17   Wt 258 lb (117 kg)   SpO2 100%   BMI 37.02 kg/m   General Appearance:    Alert, cooperative, no distress, appears stated age  Head:    Normocephalic, without obvious abnormality, atraumatic  Eyes:    PERRL, conjunctiva/corneas clear, EOM's intact, fundi    benign, both eyes        Throat:   Lips, mucosa, and tongue normal; teeth and gums normal  Neck:   Supple, symmetrical, trachea midline, no adenopathy;    thyroid:  no enlargement/tenderness/nodules; no carotid   bruit or JVD  Back:     Symmetric, no curvature, ROM normal, no CVA tenderness  Lungs:     Clear to auscultation bilaterally, respirations unlabored  Chest Wall:    No tenderness or deformity   Heart:    Regular rate and rhythm, S1 and S2 normal, no murmur, rub   or gallop     Abdomen:     Soft, non-tender, bowel sounds active all four quadrants,    no masses, no organomegaly        Extremities:   Extremities normal, atraumatic, no cyanosis or edema  Pulses:   2+ and symmetric all extremities  Skin:   Skin color, texture, turgor normal, no rashes or lesions  Lymph nodes:   Cervical, supraclavicular, and axillary nodes normal  Neurologic:   CNII-XII intact, normal strength, sensation and reflexes    throughout    LABORATORY DATA:  Results for orders placed or performed in visit on 04/04/17 (from the past 48 hour(s))  CBC w/Diff     Status: None   Collection Time: 04/04/17  2:53 PM  Result Value Ref Range   WBC 7.6 3.9  - 10.0 10e3/uL   RBC 4.47 3.70 - 5.32 10e6/uL   HGB 13.7 11.6 - 15.9 g/dL   HCT 16.1 09.6 - 04.5 %   MCV 94 81 - 101 fL   MCH 30.6 26.0 - 34.0 pg   MCHC 32.8 32.0 - 36.0 g/dL   RDW 40.9 81.1 - 91.4 %   Platelets 285 145 - 400 10e3/uL   NEUT# 3.9 1.5 - 6.5 10e3/uL   LYMPH# 2.7 0.9 -  3.3 10e3/uL   MONO# 0.6 0.1 - 0.9 10e3/uL   Eosinophils Absolute 0.5 0.0 - 0.5 10e3/uL   BASO# 0.0 0.0 - 0.2 10e3/uL   NEUT% 51.5 39.6 - 80.0 %   LYMPH% 34.9 14.0 - 48.0 %   MONO% 7.2 0.0 - 13.0 %   EOS% 5.9 0.0 - 7.0 %   BASO% 0.5 0.0 - 2.0 %  Smear     Status: None   Collection Time: 04/04/17  2:53 PM  Result Value Ref Range   Smear Result Smear Available       RADIOGRAPHY: No results found.     PATHOLOGY: None  ASSESSMENT/PLAN: Ms. Blann is a very pleasant 56 yo caucasian female with history of iron deficiency. She was last seen by Dr. Myna Hidalgo in December 2013 and is here today to re-establish care. She is feeling fatigued and concerned that her iron studies have dropped. It has been over 5 years since her last infusion.  Her CBC is well within normal limits. We will see what her iron studies show and bring her back in next week for an infusion if needed.  We will also check a TSH on her.  I have placed a referral for Dr. Terri Piedra to evaluate the possible skin cancer lesion on the left side of her nose.  We spent 30 minutes face to face with greater than 50% of that counseling and coordinating care.  She will continue to follow-up with [place] as needed.   All questions were answered. She will contact our office with any questions or concerns. We can certainly see her when she need [place]..  She was discussed with and also seen by Dr. Myna Hidalgo and he is in agreement with the aforementioned.   [facility]     Addendum:  I saw and examined the patient with Sarah. It is been about 5 years since we saw her. I think she may have moved away area and she now is back in town.  I agree with Maralyn Sago  and that there is a suspicious lesion on the left side of her face near the nasal area. This is quite dark. I believe this does need to be looked at and biopsied.  She certainly is not anemic. Her iron studies showed a ferritin of 53 with iron saturation of 23%. This, from my point of view, is okay.  Her TSH is fine at 0.98.  We will have her come back as needed. Again, I hope that this lesion on the left side of her face is not malignant.  We spent about 35-40 minutes with her.  Christin Bach, MD

## 2017-04-05 LAB — IRON AND TIBC
%SAT: 23 % (ref 21–57)
Iron: 64 ug/dL (ref 41–142)
TIBC: 280 ug/dL (ref 236–444)
UIBC: 216 ug/dL (ref 120–384)

## 2017-04-05 LAB — TSH: TSH: 0.984 m[IU]/L (ref 0.308–3.960)

## 2017-04-05 LAB — FERRITIN: FERRITIN: 53 ng/mL (ref 9–269)

## 2017-04-06 ENCOUNTER — Telehealth: Payer: Self-pay | Admitting: *Deleted

## 2017-04-06 ENCOUNTER — Encounter: Payer: Self-pay | Admitting: *Deleted

## 2017-04-06 NOTE — Telephone Encounter (Signed)
error 

## 2018-03-17 ENCOUNTER — Ambulatory Visit (HOSPITAL_COMMUNITY)
Admission: EM | Admit: 2018-03-17 | Discharge: 2018-03-17 | Disposition: A | Discharge status: Home / Self Care | Payer: BLUE CROSS/BLUE SHIELD | Attending: Internal Medicine | Admitting: Internal Medicine

## 2018-03-17 ENCOUNTER — Ambulatory Visit (INDEPENDENT_AMBULATORY_CARE_PROVIDER_SITE_OTHER): Payer: BLUE CROSS/BLUE SHIELD

## 2018-03-17 ENCOUNTER — Other Ambulatory Visit: Payer: Self-pay

## 2018-03-17 ENCOUNTER — Encounter (HOSPITAL_COMMUNITY): Payer: Self-pay | Admitting: *Deleted

## 2018-03-17 DIAGNOSIS — J40 Bronchitis, not specified as acute or chronic: Secondary | ICD-10-CM

## 2018-03-17 DIAGNOSIS — R062 Wheezing: Secondary | ICD-10-CM

## 2018-03-17 MED ORDER — IPRATROPIUM-ALBUTEROL 0.5-2.5 (3) MG/3ML IN SOLN
3.0000 mL | Freq: Once | RESPIRATORY_TRACT | Status: AC
  Administered 2018-03-17: 3 mL via RESPIRATORY_TRACT

## 2018-03-17 MED ORDER — BENZONATATE 200 MG PO CAPS: 200.0000 mg | ORAL_CAPSULE | Freq: Three times a day (TID) | ORAL | 0 refills | Status: AC | PRN

## 2018-03-17 MED ORDER — ALBUTEROL SULFATE HFA 108 (90 BASE) MCG/ACT IN AERS: 2.0000 | INHALATION_SPRAY | Freq: Four times a day (QID) | RESPIRATORY_TRACT | 0 refills | Status: AC | PRN

## 2018-03-17 MED ORDER — PREDNISONE 50 MG PO TABS: 50.0000 mg | ORAL_TABLET | Freq: Every day | ORAL | 0 refills | Status: AC

## 2018-03-17 MED ORDER — IPRATROPIUM-ALBUTEROL 0.5-2.5 (3) MG/3ML IN SOLN
RESPIRATORY_TRACT | Status: AC
  Filled 2018-03-17: qty 3

## 2018-03-17 NOTE — Discharge Instructions (Signed)
Your symptoms seem to be bronchitis.  Please use albuterol inhaler as needed for wheezing and shortness of breath.  Please begin prednisone daily for the next 5 days.  I have provided you with Tessalon to try for cough relief.  Otherwise you may try honey mixed in warm tea.  Please follow-up or return if symptoms worsening or not improving with treatment in 1 week.

## 2018-03-17 NOTE — ED Triage Notes (Signed)
C/O cough, congestion, body aches, fevers over past 2 days with progressive worsening.

## 2018-03-17 NOTE — ED Provider Notes (Signed)
[facility]    CSN: [id number] Arrival date & time: 03/17/18  1707     History   Chief Complaint Chief Complaint  Patient presents with  . Cough  . Fever    HPI Pierrette L Yeats is a 57 y.o. female history of GERD presenting today with cough, congestion and subjective fevers and body aches.  Symptoms have been going on for approximately 2-3 days.  She has been using Nasacort, Zyrtec as well as over-the-counter cough medicine.  Minimal sore throat.  Denies history of asthma or smoking.  Does have mild nausea from the congestion but otherwise no vomiting.  HPI  Past Medical History:  Diagnosis Date  . Barrett's esophagus 01/09/2013  . Chronic bronchitis (HCC)   . GERD (gastroesophageal reflux disease)   . Incontinence of urine   . Iron deficiency anemia     Patient Active Problem List   Diagnosis Date Noted  . CAP (community acquired pneumonia) 03/11/2016  . Chronic cough 03/24/2015  . Menopausal symptom 03/24/2015  . Chest pain 12/26/2013  . GERD (gastroesophageal reflux disease) 12/26/2013  . Barrett's esophagus without dysplasia 01/16/2013  . SKIN RASH, ALLERGIC 02/14/2011  . URTICARIA 02/08/2011  . VITAMIN D DEFICIENCY 10/05/2010  . IRON DEFICIENCY 10/05/2010  . MUSCLE PAIN 10/05/2010  . HEADACHE 10/05/2010  . KNEE PAIN, RIGHT 08/23/2010  . LOM 01/06/2010  . ANEMIA 11/02/2009  . FATIGUE 11/02/2009    Past Surgical History:  Procedure Laterality Date  . CESAREAN SECTION     1 time  . COLONOSCOPY  2012   date approx - negative exam per patient  . ENDOMETRIAL ABLATION    . EXTERNAL EAR SURGERY     Ear Bone Surgery  . JOINT REPLACEMENT    . TOTAL KNEE ARTHROPLASTY     right  . UPPER GASTROINTESTINAL ENDOSCOPY  2014    OB History   None      Home Medications    Prior to Admission medications   Medication Sig Start Date End Date Taking? Authorizing Provider  cetirizine (ZYRTEC) 10 MG tablet Take 10 mg by mouth daily.   Yes [provider]  Cholecalciferol (VITAMIN D) 1000 UNITS capsule Take 1,000 Units by mouth daily.   Yes [provider]  esomeprazole (NEXIUM) 40 MG capsule Take 40 mg by mouth daily at 12 noon.   Yes [provider]  fluticasone (FLONASE) 50 MCG/ACT nasal spray Place into both nostrils daily.   Yes [provider]  Multiple Vitamin (MULTIVITAMIN) capsule Take 1 capsule by mouth daily.   Yes [provider]  Phenyleph-CPM-DM-Aspirin (ALKA-SELTZER PLUS COLD & COUGH PO) Take by mouth.   Yes [provider]  albuterol (PROVENTIL HFA;VENTOLIN HFA) 108 (90 Base) MCG/ACT inhaler Inhale 2 puffs into the lungs every 6 (six) hours as needed for wheezing or shortness of breath. 03/17/18   Avya Flavell C, PA-C  benzonatate (TESSALON) 200 MG capsule Take 1 capsule (200 mg total) by mouth 3 (three) times daily as needed for up to 7 days for cough. 03/17/18 03/24/18  Keymiah Lyles C, PA-C  cephALEXin (KEFLEX) 500 MG capsule Take 500 mg by mouth. 03/18/17   [provider]  guaiFENesin-codeine (ROBITUSSIN AC) 100-10 MG/5ML syrup Take 5 mLs by mouth 3 (three) times daily as needed for cough. 03/10/16   Donato SchultzLowne Chase, Yvonne R, DO  predniSONE (DELTASONE) 50 MG tablet Take 1 tablet (50 mg total) by mouth daily for 5 days. 03/17/18 03/22/18  Fredrico Beedle, Junius CreamerHallie C,  PA-C  Vitamin D, Ergocalciferol, (DRISDOL) 50000 units CAPS capsule  03/03/16   [provider]    Family History Family History  Problem Relation Age of Onset  . Esophageal cancer Brother   . Aneurysm Father   . Asthma Mother   . Diabetes Mother   . Breast cancer Maternal Grandmother   . Breast cancer Paternal Grandmother     Social History Social History   Tobacco Use  . Smoking status: Never Smoker  . Smokeless tobacco: Never Used  Substance Use Topics  . Alcohol use: No  . Drug use: No     Allergies   Dexilant [dexlansoprazole]; Fluzone; Oseltamivir phosphate; Penicillins; and  Sulfonamide derivatives   Review of Systems Review of Systems  Constitutional: Positive for fatigue and fever. Negative for chills.  HENT: Positive for congestion and rhinorrhea. Negative for ear pain, sinus pressure, sore throat and trouble swallowing.   Respiratory: Positive for cough and wheezing. Negative for chest tightness and shortness of breath.   Cardiovascular: Negative for chest pain.  Gastrointestinal: Positive for nausea. Negative for abdominal pain and vomiting.  Musculoskeletal: Negative for myalgias.  Skin: Negative for rash.  Neurological: Negative for dizziness, light-headedness and headaches.     Physical Exam Triage Vital Signs ED Triage Vitals  Enc Vitals Group     BP 03/17/18 1808 109/78     Pulse Rate 03/17/18 1804 84     Resp 03/17/18 1804 18     Temp 03/17/18 1804 99.3 F (37.4 C)     Temp Source 03/17/18 1804 Oral     SpO2 03/17/18 1804 93 %     Weight --      Height --      Head Circumference --      Peak Flow --      Pain Score 03/17/18 1808 4     Pain Loc --      Pain Edu? --      Excl. in GC? --    No data found.  Updated Vital Signs BP 109/78   Pulse 84   Temp 99.3 F (37.4 C) (Oral)   Resp 18   SpO2 93%   Visual Acuity Right Eye Distance:   Left Eye Distance:   Bilateral Distance:    Right Eye Near:   Left Eye Near:    Bilateral Near:     Physical Exam  Constitutional: She appears well-developed and well-nourished. No distress.  HENT:  Head: Normocephalic and atraumatic.  Bilateral TMs nonerythematous, nasal mucosa nonerythematous with clear rhinorrhea present, posterior oropharynx mildly erythematous, no tonsillar enlargement or exudate.  Eyes: Conjunctivae are normal.  Neck: Neck supple.  Cardiovascular: Normal rate and regular rhythm.  No murmur heard. Pulmonary/Chest: Effort normal. No respiratory distress. She has wheezes.  Patient with diffuse wheezing throughout lung fields  Abdominal: Soft. There is no  tenderness.  Musculoskeletal: She exhibits no edema.  Neurological: She is alert.  Skin: Skin is warm and dry.  Psychiatric: She has a normal mood and affect.  Nursing note and vitals reviewed.    UC Treatments / Results  Labs (all labs ordered are listed, but only abnormal results are displayed) Labs Reviewed - No data to display  EKG None Radiology Dg Chest 2 View  Result Date: 03/17/2018 CLINICAL DATA:  Cough for 2 days with fever and fatigue. EXAM: CHEST - 2 VIEW COMPARISON:  03/23/2016 FINDINGS: Lungs are hyperexpanded. The lungs are clear without focal pneumonia, edema, pneumothorax or pleural effusion. The cardiopericardial  silhouette is within normal limits for size. The visualized bony structures of the thorax are intact. IMPRESSION: No active cardiopulmonary disease. Electronically Signed   By: Kennith Center M.D.   On: 03/17/2018 18:31    Procedures Procedures (including critical care time)  Medications Ordered in UC Medications  ipratropium-albuterol (DUONEB) 0.5-2.5 (3) MG/3ML nebulizer solution 3 mL (3 mLs Nebulization Given 03/17/18 1850)     Initial Impression / Assessment and Plan / UC Course  I have reviewed the triage vital signs and the nursing notes.  Pertinent labs & imaging results that were available during my care of the patient were reviewed by me and considered in my medical decision making (see chart for details).     Patient with wheezing on lung exam, likely bronchitis.  Breathing treatment provided in clinic, patient did feel slightly improved afterwards.  Wheezing slightly improved auscultation.  Sent home with albuterol, prednisone as well as Tessalon. Discussed strict return precautions. Patient verbalized understanding and is agreeable with plan.   Final Clinical Impressions(s) / UC Diagnoses   Final diagnoses:  Bronchitis    ED Discharge Orders        Ordered    albuterol (PROVENTIL HFA;VENTOLIN HFA) 108 (90 Base) MCG/ACT inhaler  Every  6 hours PRN     03/17/18 1858    predniSONE (DELTASONE) 50 MG tablet  Daily     03/17/18 1858    benzonatate (TESSALON) 200 MG capsule  3 times daily PRN     03/17/18 1858       Controlled Substance Prescriptions [place] Controlled Substance Registry consulted? Not Applicable   Lew Dawes, [place] 03/17/18 2218

## 2019-11-13 ENCOUNTER — Encounter: Payer: Self-pay | Admitting: Family Medicine

## 2020-01-23 ENCOUNTER — Encounter: Payer: BLUE CROSS/BLUE SHIELD | Admitting: Family Medicine
